# Patient Record
Sex: Female | Born: 1945 | Race: White | Hispanic: No | Marital: Married | Smoking: Never smoker
Health system: Southern US, Community
[De-identification: ages and names within clinical notes are randomized; demographics above are authoritative.]

## PROBLEM LIST (undated history)

## (undated) DIAGNOSIS — J45909 Unspecified asthma, uncomplicated: Secondary | ICD-10-CM

## (undated) DIAGNOSIS — D509 Iron deficiency anemia, unspecified: Secondary | ICD-10-CM

## (undated) DIAGNOSIS — E669 Obesity, unspecified: Secondary | ICD-10-CM

## (undated) DIAGNOSIS — I350 Nonrheumatic aortic (valve) stenosis: Secondary | ICD-10-CM

## (undated) DIAGNOSIS — I10 Essential (primary) hypertension: Secondary | ICD-10-CM

## (undated) DIAGNOSIS — K279 Peptic ulcer, site unspecified, unspecified as acute or chronic, without hemorrhage or perforation: Secondary | ICD-10-CM

## (undated) DIAGNOSIS — M810 Age-related osteoporosis without current pathological fracture: Secondary | ICD-10-CM

## (undated) DIAGNOSIS — I4892 Unspecified atrial flutter: Secondary | ICD-10-CM

## (undated) HISTORY — PX: BLADDER SUSPENSION: SHX72

## (undated) HISTORY — DX: Obesity, unspecified: E66.9

## (undated) HISTORY — DX: Unspecified asthma, uncomplicated: J45.909

## (undated) HISTORY — DX: Nonrheumatic aortic (valve) stenosis: I35.0

## (undated) HISTORY — PX: TUBAL LIGATION: SHX77

## (undated) HISTORY — PX: GASTRIC BYPASS: SHX52

## (undated) HISTORY — DX: Peptic ulcer, site unspecified, unspecified as acute or chronic, without hemorrhage or perforation: K27.9

## (undated) HISTORY — DX: Age-related osteoporosis without current pathological fracture: M81.0

## (undated) HISTORY — DX: Unspecified atrial flutter: I48.92

## (undated) HISTORY — PX: NASAL SINUS SURGERY: SHX719

## (undated) HISTORY — DX: Iron deficiency anemia, unspecified: D50.9

---

## 2005-09-09 ENCOUNTER — Encounter: Admission: RE | Admit: 2005-09-09 | Discharge: 2005-12-08 | Payer: Self-pay | Admitting: Family Medicine

## 2007-03-06 ENCOUNTER — Ambulatory Visit: Payer: Self-pay | Admitting: Internal Medicine

## 2010-11-10 NOTE — Assessment & Plan Note (Signed)
[facility]                             PULMONARY OFFICE NOTE   NAME:Betty Sheppard, Betty Sheppard                          MRN:          [id number]  DATE:03/06/2007                            DOB:          06-Apr-1946    ADDENDUM:   X-rays are available now for review from [facility], indicating  that she had a normal chest x-ray on August 07, 2006, and normal CT  scan of the chest also done on February 03, 2001.     Charlaine Dalton. Sherene Sires, MD, [facility]  Electronically Signed    MBW/MedQ  DD: 03/07/2007  DT: 03/08/2007  Job #: [id number]

## 2010-11-10 NOTE — Assessment & Plan Note (Signed)
[facility]                             PULMONARY OFFICE NOTE   NAME:HODGEJaliana, Medellin                          MRN:          [id number]  DATE:03/06/2007                            DOB:          19-Oct-1945    HISTORY:  This is a 66 year old white female never smoker, with a real  bad sore throat 20 years ago and ever since then asthma off and on  with a flare up three or four times a year but in between, no  significant symptoms.  No need for chronic therapy.  She comes in now  with daily symptoms dating all the way back to 2003, but has not  recovered back to baseline even with Advair and intermittent prednisone.  She says the thing that seemed to help her the most was being on  prolonged antibiotics for abdominal infection.  Presently, though she  is more limited by knees and fatigue than she is by dyspnea and does not  really think that her inhalers are doing her any good.  She denies any  pleuritic or exertional chest pain, orthopnea, PND or leg swelling.   The patient does have history of sleep apnea but says she cannot  tolerate any mask and also has been diagnosed with pulmonary  hypertension.   ALLERGIES:  None known.   PAST MEDICAL HISTORY:  1. Hypertension.  2. Morbid obesity complicated by hypertension.  3. Sleep apnea as noted above.  4. She has had remote sinus surgery in 2003. She says that didn't      help me at all.   Presently she is bothered by dyspnea with minimal activity and a hacking  cough that is worse after supper and does not seem to respond to her  Xopenex inhaler.   ALLERGIES:  None known.   MEDICATIONS:  Taken in detail on the worksheet.  Notable that she is on  Advair at the very highest dose.  Also using Singulair daily.   SOCIAL HISTORY:  She has never smoked.  Has worked as a [employer] operational officer.   FAMILY HISTORY:  Significant for heart disease in her mother, emphysema  in her brother.   REVIEW OF SYSTEMS:  Taken in  detail on the worksheet. Negative except as  outlined above.   PHYSICAL EXAMINATION:  GENERAL:  This is an obese, ambulatory white  female, 335 pounds and only 5 feet 3 inches.  HEENT:  Remarkable for nasal turbinate edema that is mild in nature.  Oropharynx clear.  NECK:  Supple without cervical adenopathy or tenderness.  Trachea  midline. No thyromegaly.  CHEST:  Perfectly clear bilaterally to percussion with excellent air  movement.  HEART:  Regular rhythm without murmur, gallop or rub.  S1 and S2 were  diminished, but no definite increase in P2.  ABDOMEN:  Massively obese, benign.  EXTREMITIES:  Warm without calf tenderness, cyanosis or clubbing or  edema.   LABORATORY DATA:  Chest x-rays were brought with the patient but they  had no date on them.  They showed relatively small lung volumes.  She  had  approximately 100 pages of outpatient notes all with some  nonspecific comments, like I am not sure what is going on here in  terms of an impression.  There is a note that she has calcified  granulomas CT scan but I do not have the actual CT scan reports from  [facility].   Her EKG dated July 25, 2006 is normal with no evidence of right  ventricular abnormality.   Polysomnogram report dated October 16, 2006 shows severe obstructive sleep  apnea with desaturations in the 80% range.   IMPRESSION:  I am not convinced this patient actually has asthma.  Note  that she did not need any form of chronic therapy prior to 2003 and now  has refractory symptoms even on the highest strength of Advair.  I  strongly suspect that much of her problem is reflux-related and/or  related to morbid obesity as evidenced by her PFTs that were reviewed  from September 09, 2006 showing no significant air flow obstruction and the  most significant finding being one of disproportionate reduction in  expiratory reserve volume.   I am going to recommend that she stop Advair at this point and   empirically place her on very low doses of Qvar at 40 mg two puffs  b.i.d. in case there is an asthmatic component to her problem that will  exacerbate off the Advair.  I further recommended for cough using  Mucinex DM two b.i.d.  if still coughing, add tramadol 50 mg every four  hours and, only if wheezing or short of breath, try Xopenex two puffs  every four hours.   I then spent extra time teaching her how to use her MDI effectively.  I  found out she was initially not capable of using MDI, so it may well be  her failure to improve on inhalers is due to failure to inhale.  Hopefully, we will be able to sort his out, depending on how she  responds to the above measures.   I would like to see her back in 15 days and at that point have a chance  to review the CT scans that she brought with her and also her response  to the above regimen.   I note that she is reported to have pulmonary hypertension with severe  sleep apnea.  Apparently she had trouble tolerating a mass but I would  be happy to refer her on to our sleep doctors here once I have sorted  out whether or not she had evidence of air flow obstruction that is  potentially reversible.   I did review with her a dietary measures to confront reflux which is  undoubtedly a component of her problem and ask her to switch the Nexium  to taking it before supper instead of taking it  before breakfast because most of her symptoms are exacerbated in the  early evening before she goes to bed.     Charlaine Dalton. Sherene Sires, MD, [facility]  Electronically Signed    MBW/MedQ  DD: 03/06/2007  DT: 03/07/2007  Job #: [id number]   cc:   Brandt Loosen, M.D.

## 2015-03-02 ENCOUNTER — Encounter (HOSPITAL_BASED_OUTPATIENT_CLINIC_OR_DEPARTMENT_OTHER): Payer: Self-pay | Admitting: Emergency Medicine

## 2015-03-02 ENCOUNTER — Emergency Department (HOSPITAL_BASED_OUTPATIENT_CLINIC_OR_DEPARTMENT_OTHER): Payer: Medicare Other

## 2015-03-02 ENCOUNTER — Emergency Department (HOSPITAL_BASED_OUTPATIENT_CLINIC_OR_DEPARTMENT_OTHER)
Admission: EM | Admit: 2015-03-02 | Discharge: 2015-03-02 | Disposition: A | Discharge status: Other Acute Inpatient Hospital | Payer: Medicare Other | Attending: Emergency Medicine | Admitting: Emergency Medicine

## 2015-03-02 DIAGNOSIS — R112 Nausea with vomiting, unspecified: Secondary | ICD-10-CM | POA: Insufficient documentation

## 2015-03-02 DIAGNOSIS — N2889 Other specified disorders of kidney and ureter: Secondary | ICD-10-CM | POA: Diagnosis not present

## 2015-03-02 DIAGNOSIS — Z79899 Other long term (current) drug therapy: Secondary | ICD-10-CM | POA: Insufficient documentation

## 2015-03-02 DIAGNOSIS — R1110 Vomiting, unspecified: Secondary | ICD-10-CM

## 2015-03-02 DIAGNOSIS — I10 Essential (primary) hypertension: Secondary | ICD-10-CM | POA: Diagnosis not present

## 2015-03-02 DIAGNOSIS — Z792 Long term (current) use of antibiotics: Secondary | ICD-10-CM | POA: Diagnosis not present

## 2015-03-02 HISTORY — DX: Essential (primary) hypertension: I10

## 2015-03-02 LAB — COMPREHENSIVE METABOLIC PANEL
ALT: 10 U/L — AB (ref 14–54)
ANION GAP: 11 (ref 5–15)
AST: 22 U/L (ref 15–41)
Albumin: 4.3 g/dL (ref 3.5–5.0)
Alkaline Phosphatase: 85 U/L (ref 38–126)
BUN: 15 mg/dL (ref 6–20)
CHLORIDE: 105 mmol/L (ref 101–111)
CO2: 22 mmol/L (ref 22–32)
Calcium: 9.7 mg/dL (ref 8.9–10.3)
Creatinine, Ser: 0.77 mg/dL (ref 0.44–1.00)
Glucose, Bld: 118 mg/dL — ABNORMAL HIGH (ref 65–99)
POTASSIUM: 3.9 mmol/L (ref 3.5–5.1)
SODIUM: 138 mmol/L (ref 135–145)
Total Bilirubin: 0.7 mg/dL (ref 0.3–1.2)
Total Protein: 7.8 g/dL (ref 6.5–8.1)

## 2015-03-02 LAB — URINALYSIS, ROUTINE W REFLEX MICROSCOPIC
Bilirubin Urine: NEGATIVE
GLUCOSE, UA: NEGATIVE mg/dL
Hgb urine dipstick: NEGATIVE
KETONES UR: 40 mg/dL — AB
LEUKOCYTES UA: NEGATIVE
NITRITE: NEGATIVE
PH: 7 (ref 5.0–8.0)
PROTEIN: NEGATIVE mg/dL
Specific Gravity, Urine: 1.017 (ref 1.005–1.030)
Urobilinogen, UA: 0.2 mg/dL (ref 0.0–1.0)

## 2015-03-02 LAB — CBC WITH DIFFERENTIAL/PLATELET
Basophils Absolute: 0 10*3/uL (ref 0.0–0.1)
Basophils Relative: 0 % (ref 0–1)
Eosinophils Absolute: 0 10*3/uL (ref 0.0–0.7)
Eosinophils Relative: 0 % (ref 0–5)
HEMATOCRIT: 38.8 % (ref 36.0–46.0)
HEMOGLOBIN: 13 g/dL (ref 12.0–15.0)
LYMPHS ABS: 1.3 10*3/uL (ref 0.7–4.0)
LYMPHS PCT: 15 % (ref 12–46)
MCH: 29.4 pg (ref 26.0–34.0)
MCHC: 33.5 g/dL (ref 30.0–36.0)
MCV: 87.8 fL (ref 78.0–100.0)
MONOS PCT: 5 % (ref 3–12)
Monocytes Absolute: 0.4 10*3/uL (ref 0.1–1.0)
NEUTROS ABS: 7.3 10*3/uL (ref 1.7–7.7)
NEUTROS PCT: 80 % — AB (ref 43–77)
Platelets: 280 10*3/uL (ref 150–400)
RBC: 4.42 MIL/uL (ref 3.87–5.11)
RDW: 13.8 % (ref 11.5–15.5)
WBC: 9.1 10*3/uL (ref 4.0–10.5)

## 2015-03-02 LAB — LIPASE, BLOOD: Lipase: 13 U/L — ABNORMAL LOW (ref 22–51)

## 2015-03-02 MED ORDER — SODIUM CHLORIDE 0.9 % IV SOLN
INTRAVENOUS | Status: DC
  Administered 2015-03-02: 17:00:00 via INTRAVENOUS

## 2015-03-02 MED ORDER — IOHEXOL 300 MG/ML  SOLN
100.0000 mL | Freq: Once | INTRAMUSCULAR | Status: AC | PRN
  Administered 2015-03-02: 100 mL via INTRAVENOUS

## 2015-03-02 MED ORDER — IOHEXOL 300 MG/ML  SOLN
25.0000 mL | Freq: Once | INTRAMUSCULAR | Status: AC | PRN
  Administered 2015-03-02: 25 mL via ORAL

## 2015-03-02 MED ORDER — ONDANSETRON HCL 4 MG/2ML IJ SOLN
4.0000 mg | Freq: Once | INTRAMUSCULAR | Status: AC
Start: 2015-03-02 — End: 2015-03-02
  Administered 2015-03-02: 4 mg via INTRAVENOUS
  Filled 2015-03-02: qty 2

## 2015-03-02 MED ORDER — MORPHINE SULFATE (PF) 4 MG/ML IV SOLN
4.0000 mg | Freq: Once | INTRAVENOUS | Status: AC
  Administered 2015-03-02: 4 mg via INTRAVENOUS
  Filled 2015-03-02: qty 1

## 2015-03-02 MED ORDER — ONDANSETRON HCL 4 MG/2ML IJ SOLN
4.0000 mg | Freq: Once | INTRAMUSCULAR | Status: AC
  Administered 2015-03-02: 4 mg via INTRAVENOUS
  Filled 2015-03-02: qty 2

## 2015-03-02 MED ORDER — PROMETHAZINE HCL 25 MG/ML IJ SOLN
12.5000 mg | Freq: Once | INTRAMUSCULAR | Status: AC
Start: 2015-03-02 — End: 2015-03-02
  Administered 2015-03-02: 12.5 mg via INTRAVENOUS
  Filled 2015-03-02: qty 1

## 2015-03-02 MED ORDER — SODIUM CHLORIDE 0.9 % IV BOLUS (SEPSIS)
1000.0000 mL | Freq: Once | INTRAVENOUS | Status: AC
  Administered 2015-03-02: 1000 mL via INTRAVENOUS

## 2015-03-02 NOTE — ED Notes (Signed)
Pt reports that she awoke around 0200 with nausea and mid abdominal pain, feels like if she vomited she would feel better, but she is unable to vomit anything but "white foam", states she has had gastric bypass 7 years ago

## 2015-03-02 NOTE — ED Provider Notes (Signed)
CSN: [id number]     Arrival date & time 03/02/15  1206 History   First MD Initiated Contact with Patient 03/02/15 1211     Chief Complaint  Patient presents with  . Nausea     (Consider location/radiation/quality/duration/timing/severity/associated sxs/prior Treatment) HPI Comments: Patient with a history of gastric bypass surgery, Roux-en-Y 7 years ago presents with upper abdominal pain. She states she woke up about 1 AM this morning with nausea and feeling that she needed to vomit. She's only vomited some white foam type stuff. She had sudden onset of pain at that time across her upper abdomen. It's been constant since that time. She's having normal bowel movements. She states she is passing gas. She denies any known fevers. She's not had any prior issues with the gastric bypass surgery. This was done in [place].     Past Medical History  Diagnosis Date  . Hypertension    Past Surgical History  Procedure Laterality Date  . Gastric bypass     History reviewed. No pertinent family history. Social History  Substance Use Topics  . Smoking status: Never Smoker   . Smokeless tobacco: None  . Alcohol Use: No   OB History    No data available     Review of Systems  Constitutional: Negative for fever, chills, diaphoresis and fatigue.  HENT: Negative for congestion, rhinorrhea and sneezing.   Eyes: Negative.   Respiratory: Negative for cough, chest tightness and shortness of breath.   Cardiovascular: Negative for chest pain and leg swelling.  Gastrointestinal: Positive for nausea and abdominal pain. Negative for vomiting, diarrhea and blood in stool.  Genitourinary: Negative for frequency, hematuria, flank pain and difficulty urinating.  Musculoskeletal: Negative for back pain and arthralgias.  Skin: Negative for rash.  Neurological: Negative for dizziness, speech difficulty, weakness, numbness and headaches.      Allergies  Levaquin and Prednisone  Home Medications   Prior  to Admission medications   Medication Sig Start Date End Date Taking? Authorizing Provider  amoxicillin (AMOXIL) 500 MG capsule Take 500 mg by mouth 3 (three) times daily.   Yes Historical Provider, MD  calcium-vitamin D (OSCAL WITH D) 500-200 MG-UNIT per tablet Take 1 tablet by mouth.   Yes Historical Provider, MD  diltiazem (DILACOR XR) 240 MG 24 hr capsule Take 240 mg by mouth daily.   Yes Historical Provider, MD  enalapril (VASOTEC) 20 MG tablet Take 20 mg by mouth daily.   Yes Historical Provider, MD  hydrochlorothiazide (MICROZIDE) 12.5 MG capsule Take 12.5 mg by mouth daily.   Yes Historical Provider, MD  montelukast (SINGULAIR) 10 MG tablet Take 10 mg by mouth at bedtime.   Yes Historical Provider, MD  oxyCODONE-acetaminophen (TYLOX) 5-500 MG per capsule Take 1 capsule by mouth every 4 (four) hours as needed for pain.   Yes Historical Provider, MD   BP 155/68 mmHg  Pulse 65  Temp(Src) 98 F (36.7 C) (Oral)  Resp 18  Ht 5\' 3"  (1.6 m)  Wt 180 lb (81.647 kg)  BMI 31.89 kg/m2  SpO2 100% Physical Exam  Constitutional: She is oriented to person, place, and time. She appears well-developed and well-nourished.  HENT:  Head: Normocephalic and atraumatic.  Eyes: Pupils are equal, round, and reactive to light.  Neck: Normal range of motion. Neck supple.  Cardiovascular: Normal rate, regular rhythm and normal heart sounds.   Pulmonary/Chest: Effort normal and breath sounds normal. No respiratory distress. She has no wheezes. She has no rales. She exhibits no  tenderness.  Abdominal: Soft. Bowel sounds are normal. There is tenderness (moderate tenderness across the upper abdomen). There is no rebound and no guarding.  Musculoskeletal: Normal range of motion. She exhibits no edema.  Lymphadenopathy:    She has no cervical adenopathy.  Neurological: She is alert and oriented to person, place, and time.  Skin: Skin is warm and dry. No rash noted.  Psychiatric: She has a normal mood and  affect.    ED Course  Procedures (including critical care time) Labs Review Labs Reviewed  COMPREHENSIVE METABOLIC PANEL - Abnormal; Notable for the following:    Glucose, Bld 118 (*)    ALT 10 (*)    All other components within normal limits  CBC WITH DIFFERENTIAL/PLATELET - Abnormal; Notable for the following:    Neutrophils Relative % 80 (*)    All other components within normal limits  LIPASE, BLOOD - Abnormal; Notable for the following:    Lipase 13 (*)    All other components within normal limits  URINALYSIS, ROUTINE W REFLEX MICROSCOPIC (NOT AT [facility]) - Abnormal; Notable for the following:    Ketones, ur 40 (*)    All other components within normal limits    Imaging Review Ct Abdomen Pelvis W Contrast  03/02/2015   CLINICAL DATA:  Sudden onset upper abdominal pain. Vomiting. Status post gastric bypass 7 years ago. Unable to tolerate oral contrast.  EXAM: CT ABDOMEN AND PELVIS WITH CONTRAST  TECHNIQUE: Multidetector CT imaging of the abdomen and pelvis was performed using the standard protocol following bolus administration of intravenous contrast.  CONTRAST:  25mL OMNIPAQUE IOHEXOL 300 MG/ML SOLN, OMNIPAQUE IOHEXOL 300 MG/ML SOLN  COMPARISON:  Radiographs obtained earlier today and abdomen and pelvis CT dated 12/30/2006.  FINDINGS: Previously noted calcified granulomata in the liver and spleen. No urinary tract calcifications are seen. Upper and mid abdominal surgical staples, compatible with the history of gastric bypass. No gastric or bowel dilatation. No free peritoneal fluid or air.  Oval area of low density in the mid left kidney, measuring 1.3 cm in maximum diameter on coronal image number 52. This measures 48 Hounsfield units in density on coronal image number 52 and 47 Hounsfield units in density on axial image number 30. It is difficult to determine if this ridge previously present due to in lack of intravenous contrast on the previous examination. On the delayed images  through the kidneys, this measures 58 Hounsfield units in density.  Unremarkable gallbladder, adrenal glands, right kidney, ureters, urinary bladder, uterus and ovaries. No enlarged lymph nodes. Clear lung bases. 5 mm linear nodule in the right middle lobe on image number 4, unchanged. There is also a 4 mm nodule in the left lower lobe on image number 30, not previously included.  Multiple colonic diverticula without evidence of diverticulitis. No evidence of appendicitis. Lumbar and lower thoracic spine degenerative changes and mild scoliosis.  IMPRESSION: 1. No acute abnormality. 2. 1.3 cm oval mass in the mid left kidney. This could represent a complex cyst or solid mass. This could be better characterized with pre and postcontrast magnetic resonance imaging of the kidneys on an outpatient basis. 3. Stable 5 mm right middle lobe nodule. The long-term stability is compatible with a benign process. 4. Interval inclusion of a 4 mm nodule in the left lower lobe, of doubtful clinical significance. This most likely represents a noncalcified granuloma in this patient with evidence of previous granulomatous infection. 5. Colonic diverticulosis without evidence of diverticulitis.   Electronically  Signed   By: Beckie Salts M.D.   On: 03/02/2015 14:43   Dg Abd Acute W/chest  03/02/2015   CLINICAL DATA:  Diffuse lower abdominal pain. Vomiting. History of gastric bypass.  EXAM: DG ABDOMEN ACUTE W/ 1V CHEST  COMPARISON:  Chest radiographs dated 10/29/2013. Abdomen and pelvis CT dated 12/30/2006.  FINDINGS: Mildly enlarged cardiac silhouette. Prominent pulmonary vasculature. Minimally prominent interstitial markings. Normal bowel gas pattern without free peritoneal air. Lumbar spine degenerative changes and mild scoliosis. Left upper and mid abdominal surgical staples. Small rounded calcifications in the right mid abdomen. These have a variable position relative to the right kidney and correspond to calcified granulomas seen  in the liver on the previous CT. There are also calcified granulomas again demonstrated in the spleen.  IMPRESSION: 1. No acute abnormality. 2. Stable mild cardiomegaly.   Electronically Signed   By: Beckie Salts M.D.   On: 03/02/2015 13:16   I have personally reviewed and evaluated these images and lab results as part of my medical decision-making.   EKG Interpretation None      MDM   Final diagnoses:  Renal mass  Intractable vomiting with nausea, vomiting of unspecified type    Patient presents with upper abdominal pain and vomiting. Her CT scan is unremarkable other than a small mass in her left kidney which will need outpatient follow-up. I did discuss this with the patient and her husband. I spoke with the general surgeon on-call with our system who felt that given the patient has had gastric bypass surgery, she needs to be seen by a bariatric surgeon. Given that she had the surgery at [facility], I spoke with the bariatric surgeon at [facility] who is accepted the patient for transfer. We will send a copy of the radiology disc with the patient.    Rolan Bucco, MD 03/02/15 [phone]

## 2016-09-08 IMAGING — CR DG ABDOMEN ACUTE W/ 1V CHEST
3 series · 3 of 3 positions shown · non-contrast
Comparison: Chest radiographs dated 10/29/2013. Abdomen and pelvis
CT dated 12/30/2006.

CLINICAL DATA: Diffuse lower abdominal pain. Vomiting. History of
gastric bypass.

EXAM:
DG ABDOMEN ACUTE W/ 1V CHEST

[w chest pa]
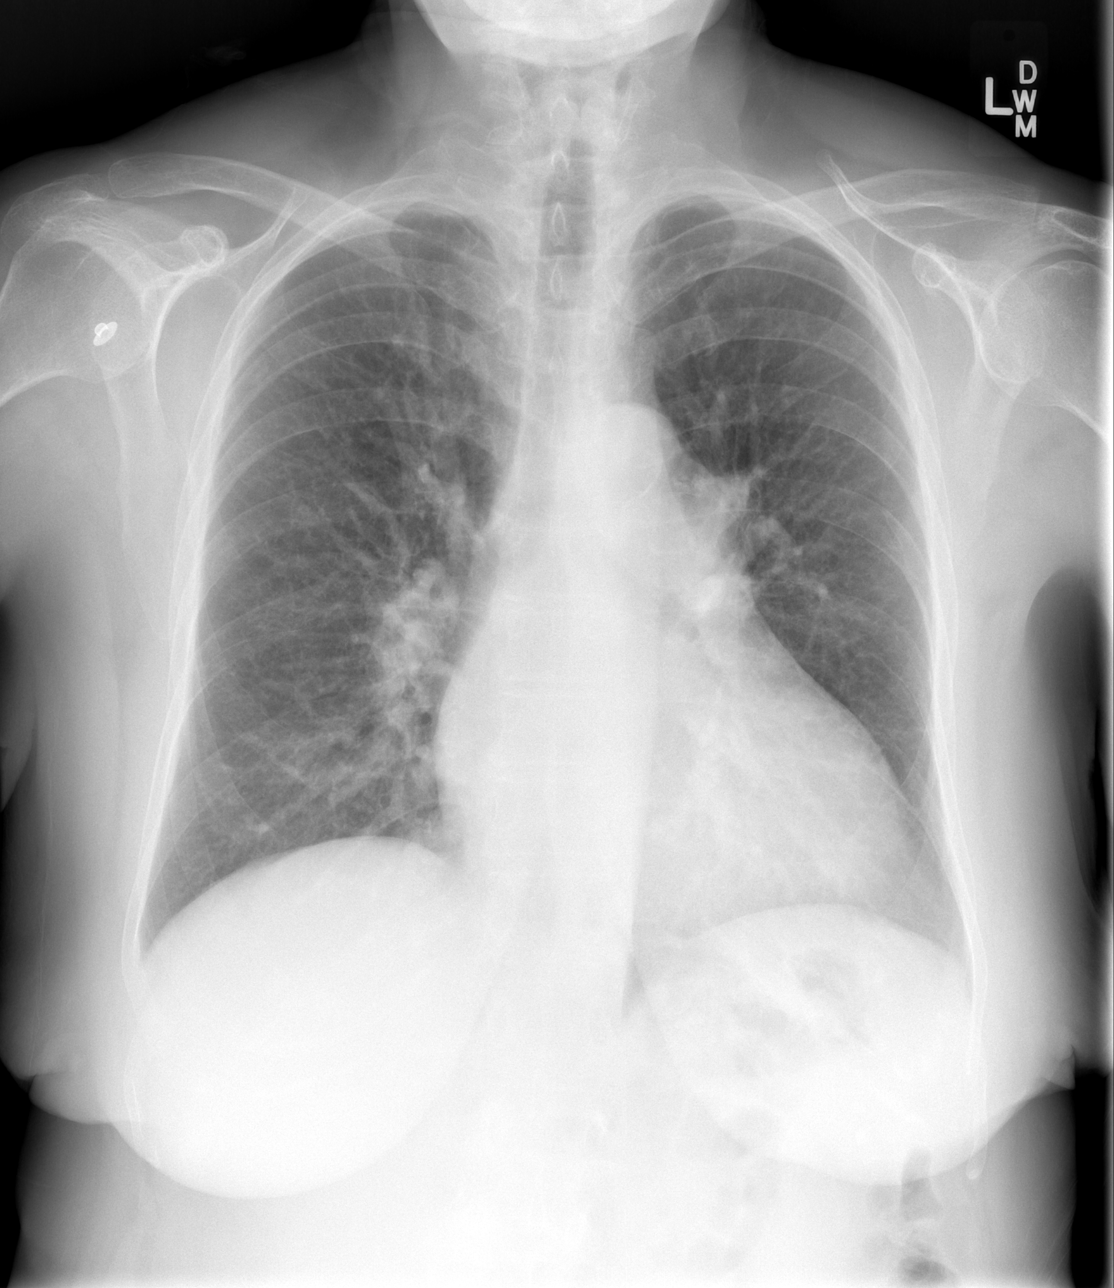

[w abdomen upright]
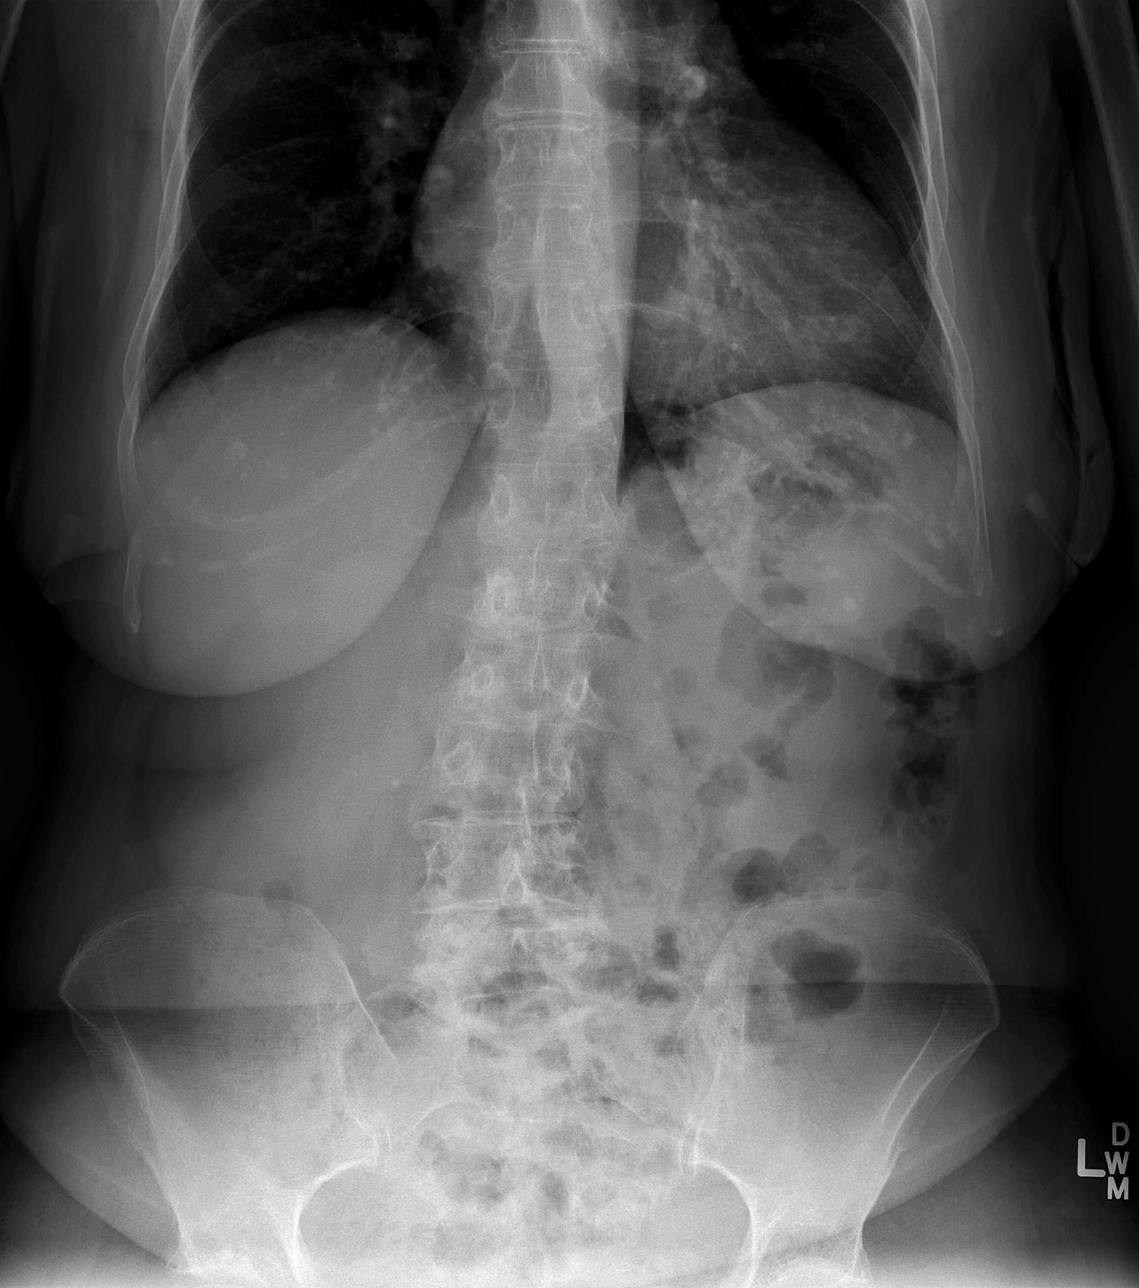

[t abdomen supine]
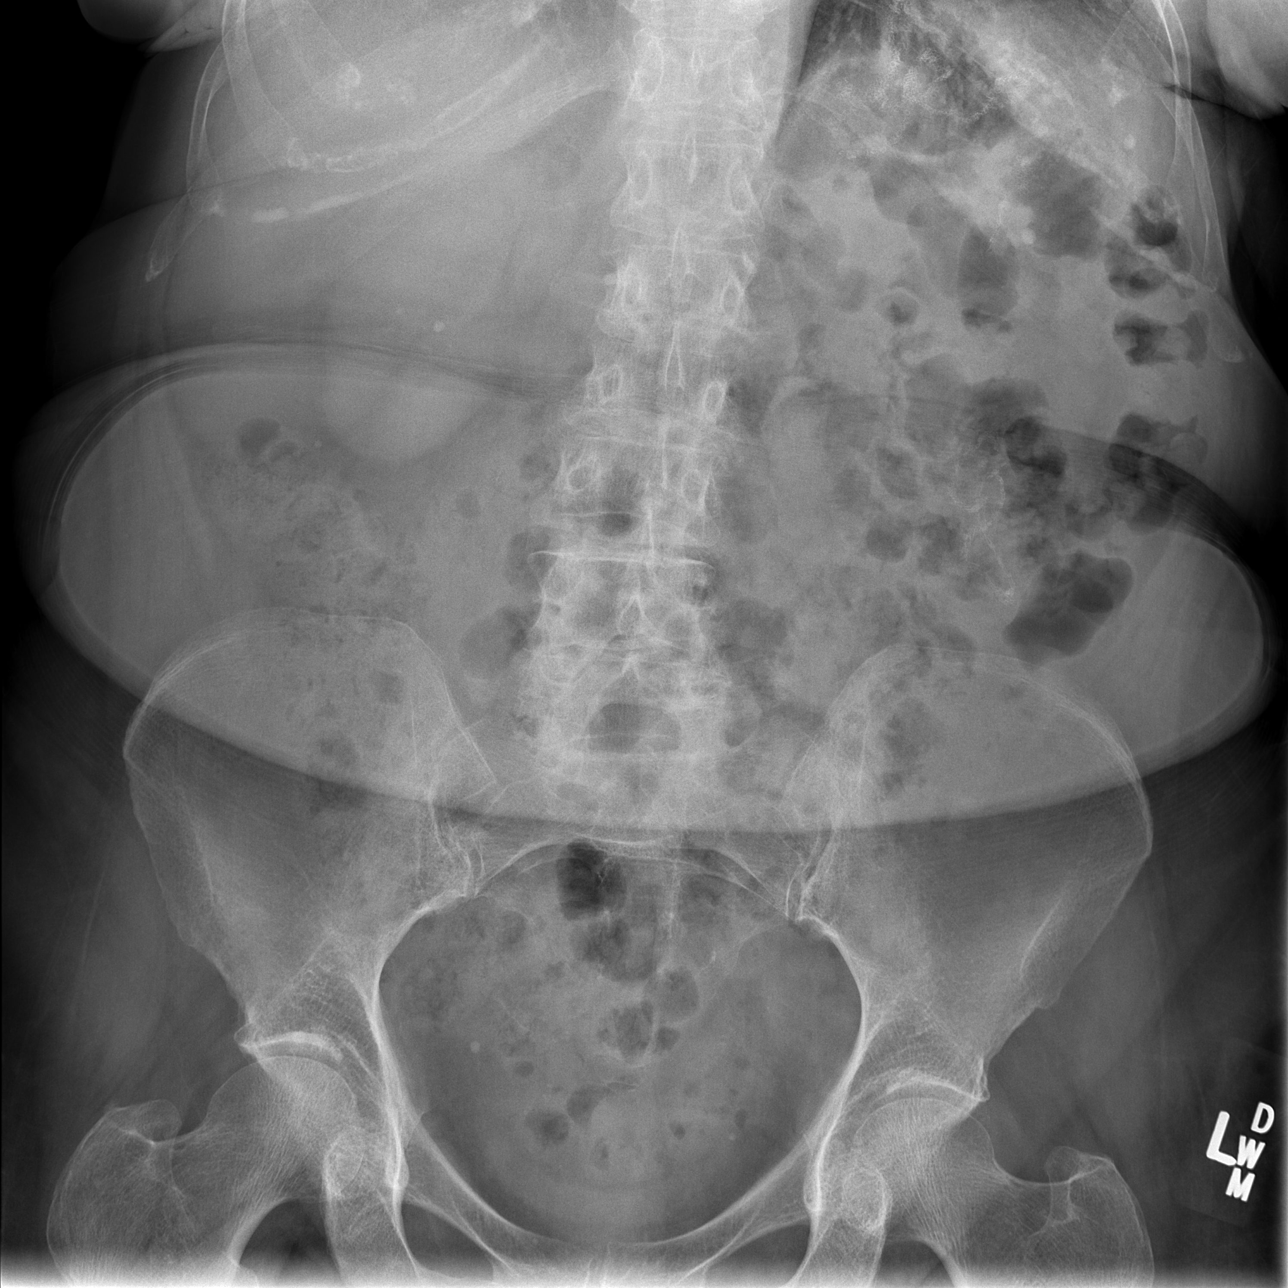

[3 of 3 positions shown; findings below may reference images not displayed]

FINDINGS: Mildly enlarged cardiac silhouette. Prominent pulmonary vasculature.
Minimally prominent interstitial markings. Normal bowel gas pattern
without free peritoneal air. Lumbar spine degenerative changes and
mild scoliosis. Left upper and mid abdominal surgical staples. Small
rounded calcifications in the right mid abdomen. These have a
variable position relative to the right kidney and correspond to
calcified granulomas seen in the liver on the previous CT. There are
also calcified granulomas again demonstrated in the spleen.
IMPRESSION: 1. No acute abnormality.
2. Stable mild cardiomegaly.

## 2016-09-08 IMAGING — CT CT ABD-PELV W/ CM
2 of 5 series · 16 of 46 positions shown, 18 images · IV contrast (APPLIED)
Comparison: Radiographs obtained earlier today and abdomen and
pelvis CT dated 12/30/2006.

CLINICAL DATA: Sudden onset upper abdominal pain. Vomiting. Status
post gastric bypass 7 years ago. Unable to tolerate oral contrast.

EXAM:
CT ABDOMEN AND PELVIS WITH CONTRAST
TECHNIQUE: Multidetector CT imaging of the abdomen and pelvis was performed
using the standard protocol following bolus administration of
intravenous contrast.
CONTRAST:  25mL OMNIPAQUE IOHEXOL 300 MG/ML SOLN, 100mL OMNIPAQUE
IOHEXOL 300 MG/ML SOLN

[Series 2: abd/pelvis 5.0 b31f · axial · 0.89mm/px · z∈[-334,+56]mm · 13 of 88 slices shown, 15 images]
[im 5/88  soft-tissue]
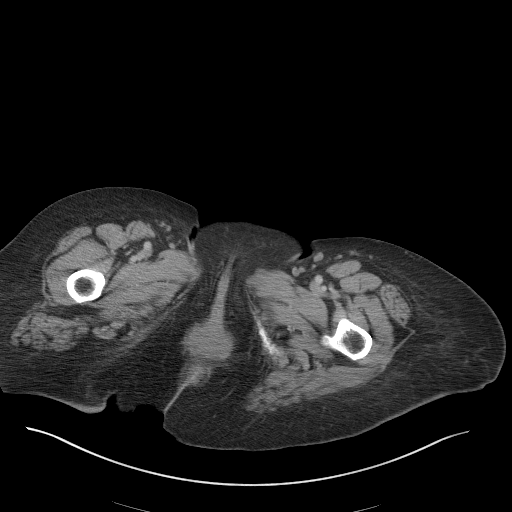
[im 5/88  bone]
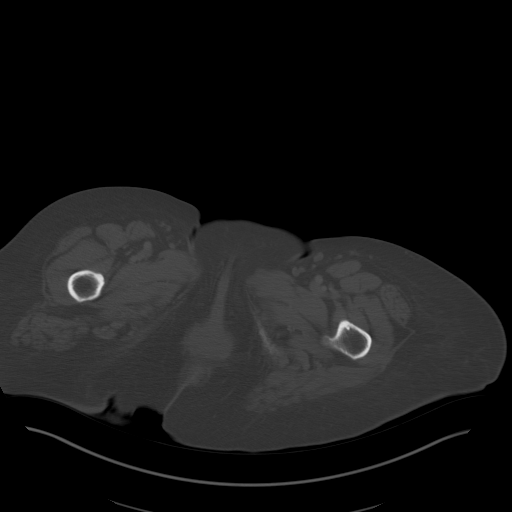
[im 14/88  soft-tissue]
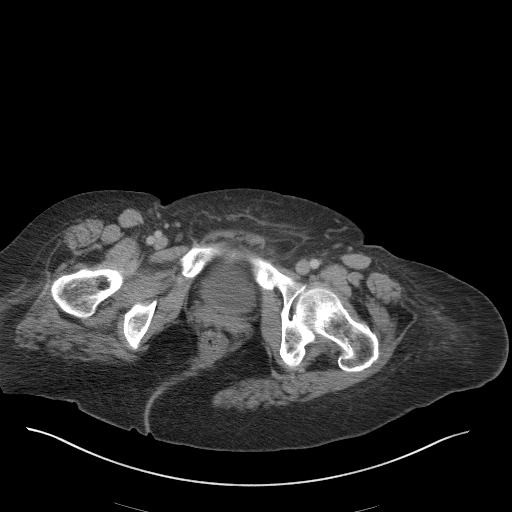
[im 19/88  soft-tissue]
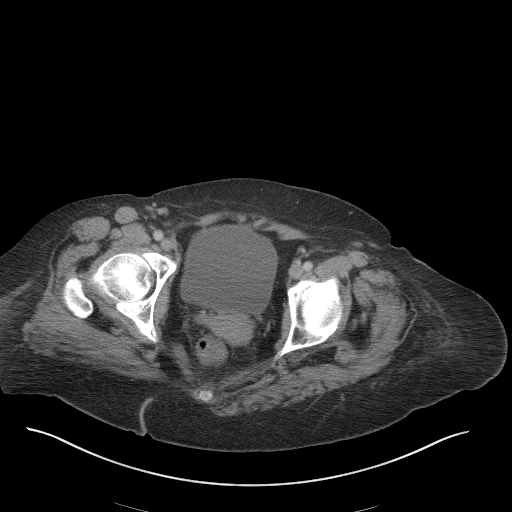
[im 23/88  soft-tissue]
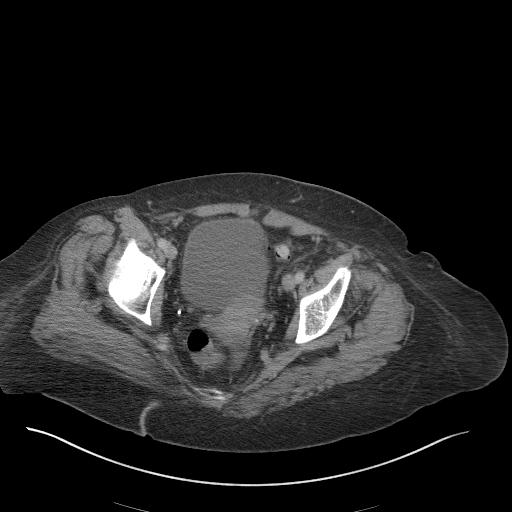
[im 33/88  soft-tissue]
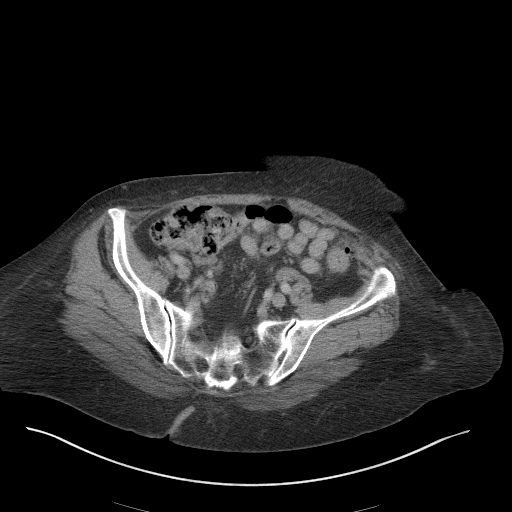
[im 37/88  soft-tissue]
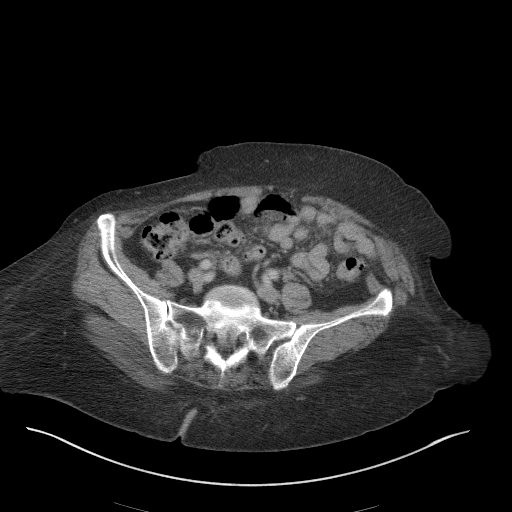
[im 46/88  soft-tissue]
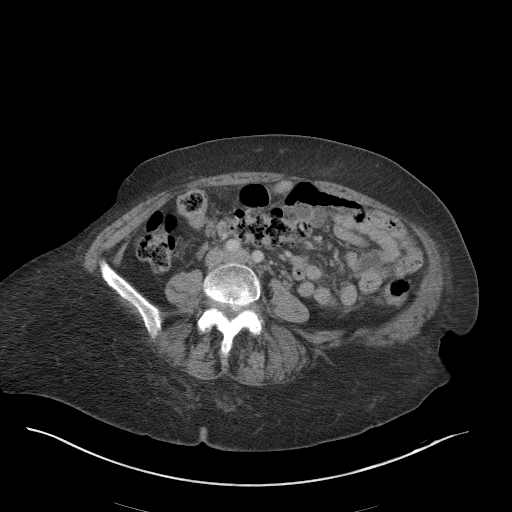
[im 51/88  soft-tissue]
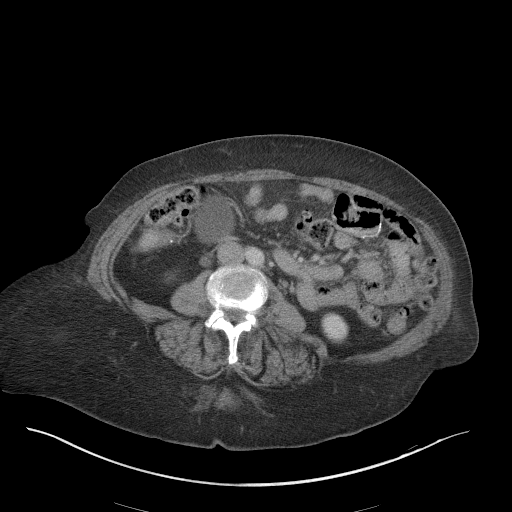
[im 55/88  soft-tissue]
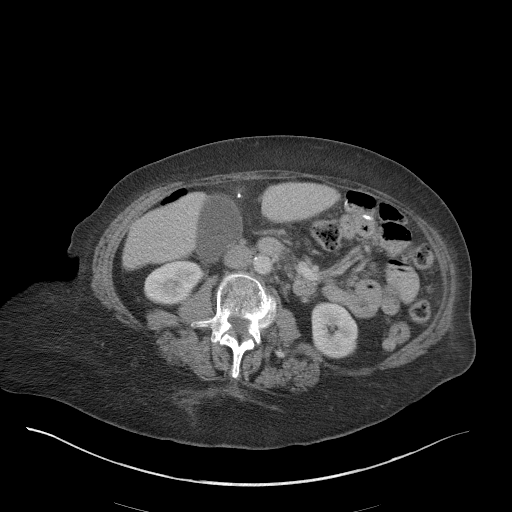
[im 55/88  bone]
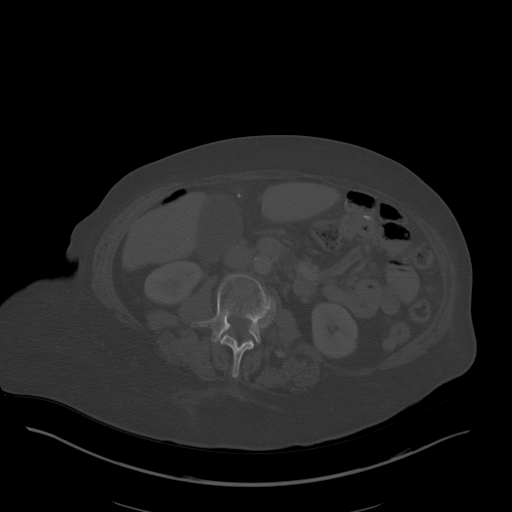
[im 65/88  soft-tissue]
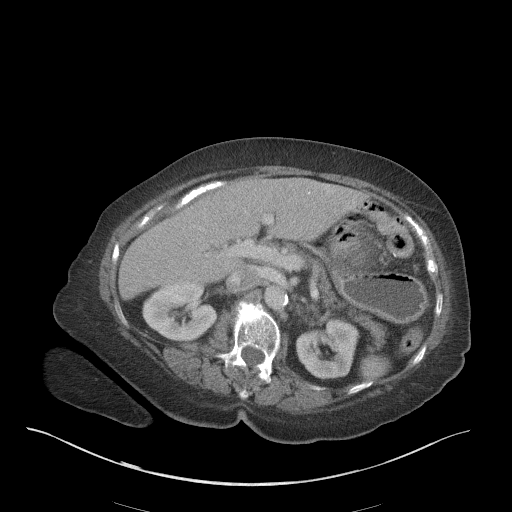
[im 69/88  soft-tissue]
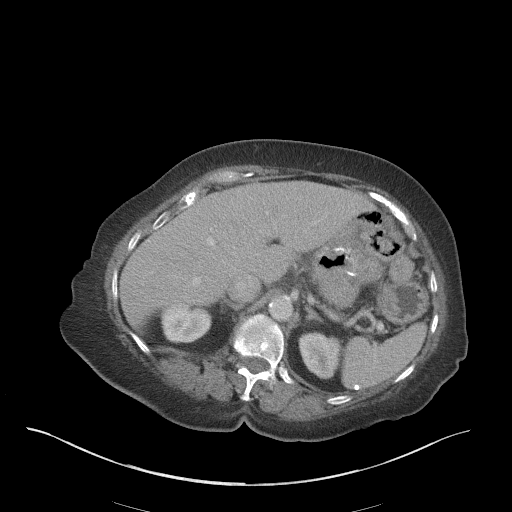
[im 74/88  soft-tissue]
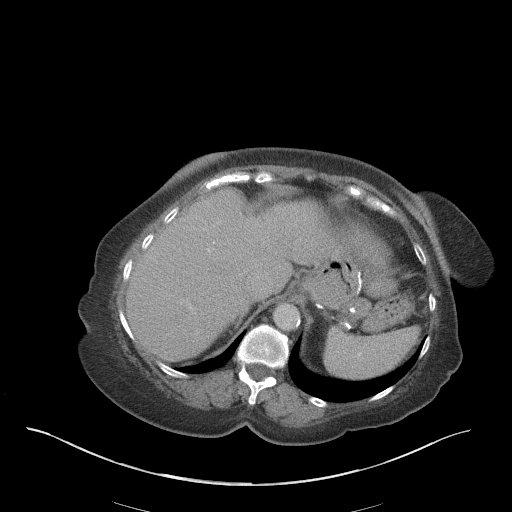
[im 83/88  soft-tissue]
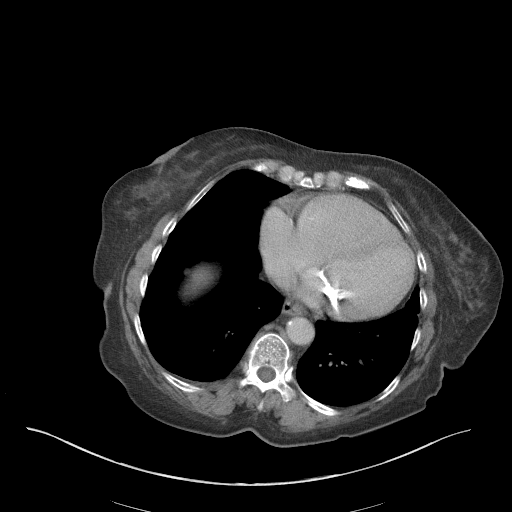

[Series 5: abd/pelvis 3.0 coronal · coronal · 0.90mm/px · 3 of 85 slices shown]
[im 29/85  soft-tissue]
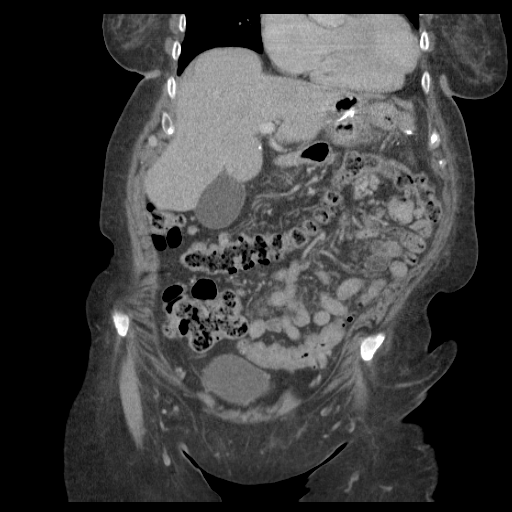
[im 38/85  soft-tissue]
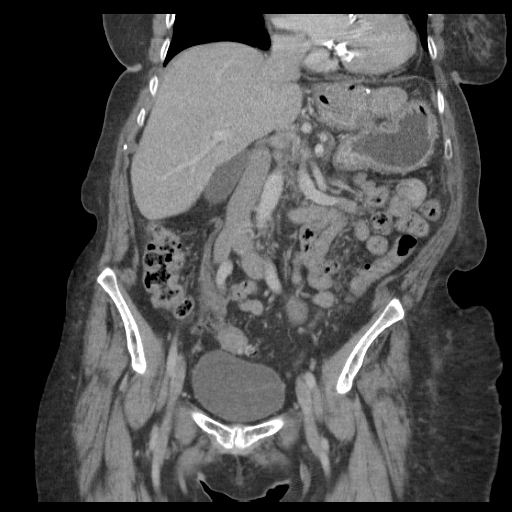
[im 47/85  soft-tissue]
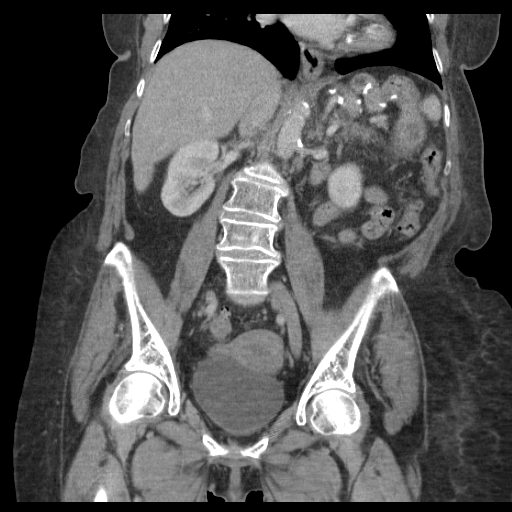

[16 of 46 positions shown; findings below may reference images not displayed]

FINDINGS: Previously noted calcified granulomata in the liver and spleen. No
urinary tract calcifications are seen. Upper and mid abdominal
surgical staples, compatible with the history of gastric bypass. No
gastric or bowel dilatation. No free peritoneal fluid or air.

Oval area of low density in the mid left kidney, measuring 1.3 cm in
maximum diameter on coronal image number 52. This measures 48
Hounsfield units in density on coronal image number 52 and 47
Hounsfield units in density on axial image number 30. It is
difficult to determine if this ridge previously present due to in
lack of intravenous contrast on the previous examination. On the
delayed images through the kidneys, this measures 58 Hounsfield
units in density.

Unremarkable gallbladder, adrenal glands, right kidney, ureters,
urinary bladder, uterus and ovaries. No enlarged lymph nodes. Clear
lung bases. 5 mm linear nodule in the right middle lobe on image
number 4, unchanged. There is also a 4 mm nodule in the left lower
lobe on image number 30, not previously included.

Multiple colonic diverticula without evidence of diverticulitis. No
evidence of appendicitis. Lumbar and lower thoracic spine
degenerative changes and mild scoliosis.
IMPRESSION: 1. No acute abnormality.
2. 1.3 cm oval mass in the mid left kidney. This could represent a
complex cyst or solid mass. This could be better characterized with
pre and postcontrast magnetic resonance imaging of the kidneys on an
outpatient basis.
3. Stable 5 mm right middle lobe nodule. The long-term stability is
compatible with a benign process.
4. Interval inclusion of a 4 mm nodule in the left lower lobe, of
doubtful clinical significance. This most likely represents a
noncalcified granuloma in this patient with evidence of previous
granulomatous infection.
5. Colonic diverticulosis without evidence of diverticulitis.

## 2018-06-28 HISTORY — PX: CARDIAC ELECTROPHYSIOLOGY STUDY AND ABLATION: SHX1294

## 2018-12-14 ENCOUNTER — Other Ambulatory Visit: Payer: Self-pay

## 2019-05-30 ENCOUNTER — Ambulatory Visit (INDEPENDENT_AMBULATORY_CARE_PROVIDER_SITE_OTHER): Payer: Medicare Other | Admitting: Critical Care Medicine

## 2019-05-30 ENCOUNTER — Other Ambulatory Visit: Payer: Self-pay

## 2019-05-30 ENCOUNTER — Encounter: Payer: Self-pay | Admitting: Critical Care Medicine

## 2019-05-30 VITALS — BP 134/70 | HR 66 | Temp 97.2°F | Ht 61.81 in | Wt 190.6 lb

## 2019-05-30 DIAGNOSIS — J4540 Moderate persistent asthma, uncomplicated: Secondary | ICD-10-CM | POA: Diagnosis not present

## 2019-05-30 DIAGNOSIS — J309 Allergic rhinitis, unspecified: Secondary | ICD-10-CM

## 2019-05-30 DIAGNOSIS — R05 Cough: Secondary | ICD-10-CM

## 2019-05-30 DIAGNOSIS — R059 Cough, unspecified: Secondary | ICD-10-CM

## 2019-05-30 LAB — CBC WITH DIFFERENTIAL/PLATELET
Basophils Absolute: 0 10*3/uL (ref 0.0–0.1)
Basophils Relative: 0.7 % (ref 0.0–3.0)
Eosinophils Absolute: 0.1 10*3/uL (ref 0.0–0.7)
Eosinophils Relative: 2.2 % (ref 0.0–5.0)
HCT: 38.8 % (ref 36.0–46.0)
Hemoglobin: 12.5 g/dL (ref 12.0–15.0)
Lymphocytes Relative: 17 % (ref 12.0–46.0)
Lymphs Abs: 0.9 10*3/uL (ref 0.7–4.0)
MCHC: 32.2 g/dL (ref 30.0–36.0)
MCV: 93.9 fl (ref 78.0–100.0)
Monocytes Absolute: 0.4 10*3/uL (ref 0.1–1.0)
Monocytes Relative: 7.8 % (ref 3.0–12.0)
Neutro Abs: 3.9 10*3/uL (ref 1.4–7.7)
Neutrophils Relative %: 72.3 % (ref 43.0–77.0)
Platelets: 256 10*3/uL (ref 150.0–400.0)
RBC: 4.13 Mil/uL (ref 3.87–5.11)
RDW: 14.7 % (ref 11.5–15.5)
WBC: 5.4 10*3/uL (ref 4.0–10.5)

## 2019-05-30 MED ORDER — CETIRIZINE HCL 10 MG PO TABS
10.0000 mg | ORAL_TABLET | Freq: Every day | ORAL | 11 refills | Status: DC
Start: 2019-05-30 — End: 2020-06-24

## 2019-05-30 MED ORDER — BREO ELLIPTA 100-25 MCG/INH IN AEPB: 1.0000 | INHALATION_SPRAY | Freq: Every day | RESPIRATORY_TRACT | 11 refills | Status: DC

## 2019-05-30 MED ORDER — BREO ELLIPTA 100-25 MCG/INH IN AEPB: 1.0000 | INHALATION_SPRAY | Freq: Every day | RESPIRATORY_TRACT | 0 refills | Status: DC

## 2019-05-30 NOTE — Telephone Encounter (Signed)
Mychart message received by pt which is posted below:  To: [facility] POOL    From: Betty Sheppard    Created: 05/30/2019 12:52 PM     *-*-*This message was handled on 05/30/2019 3:11 PM by Leslie Langille P*-*-*  Took my first does of the Breo it increase my heart rate to 83 and it is normally in the 60 range.. I just had a heart ablation on November 10 for irregular heat beat and fast heart beat.  The increase causes me concern.  Saw Dr Carlis Abbott  today   Please advise     Dr. Carlis Abbott, please advise on this for pt. Thanks!

## 2019-05-30 NOTE — Patient Instructions (Addendum)
Thank you for visiting Dr. Carlis Abbott at [facility]. We recommend the following: Orders Placed This Encounter  Procedures  . IgE  . IgG, IgA, IgM  . CBC w/Diff  . Pulmonary function test   Orders Placed This Encounter  Procedures  . IgE    Standing Status:   Future    Standing Expiration Date:   05/29/2020  . IgG, IgA, IgM    Standing Status:   Future    Standing Expiration Date:   05/29/2020  . CBC w/Diff    Standing Status:   Future    Standing Expiration Date:   05/29/2020  . Pulmonary function test    Standing Status:   Future    Standing Expiration Date:   05/29/2020    Order Specific Question:   Where should this test be performed?    Answer:    Pulmonary    Order Specific Question:   Full PFT: includes the following: basic spirometry, spirometry pre & post bronchodilator, diffusion capacity (DLCO), lung volumes    Answer:   Full PFT    Meds ordered this encounter  Medications  . cetirizine (ZYRTEC) 10 MG tablet    Sig: Take 1 tablet (10 mg total) by mouth daily.    Dispense:  30 tablet    Refill:  11  . fluticasone furoate-vilanterol (BREO ELLIPTA) 100-25 MCG/INH AEPB    Sig: Inhale 1 puff into the lungs daily.    Dispense:  1 each    Refill:  11     Stop taking Pulmicort/ budesonide nebulizers. Keep taking Singulair daily. It is okay to use levalbuterol as needed.  Return in about 6 weeks (around 07/11/2019).    Please do your part to reduce the spread of COVID-19.

## 2019-05-30 NOTE — Progress Notes (Signed)
Synopsis: Referred in December 2020 for bronchiectasis, cough by Betty Gerlach, PA.  Subjective:   PATIENT ID: Betty Sheppard GENDER: female DOB: August 21, 1945, MRN: [id number]  Chief Complaint  Patient presents with   Consult    Patient is here for cough and Bronchiectasis. Went to urgent care 11/26 and was given antibiotics which seem to be helping.     Betty Sheppard is a 73 year old woman with a history of recurrent sinusitis and reported bronchiectasis who presents to establish care by her primary care provider.  She was diagnosed with bronchiectasis based on an abnormal CT several years ago, but subsequent CT scans have not redemonstrated this per the report she provided.  She has had chronic sinus infections, requiring her to be on chronic suppressive once daily amoxicillin for many years.  When she gets a sinus infection it quickly moves to her chest where she has productive cough, wheezing, nasal congestion.  To get over this she requires a very prolonged course of antibiotics and a steroid shot.  If her symptoms have not fully resolved when she comes off antibiotics her symptoms will recur.  She often ends up requiring multiple doses of antibiotics.  When she has these episodes she has cough, thick sputum, wheezing, and severe fatigue.  She describes her sputum as looking like bread dough.  When she is at her baseline she does not have symptoms at all and is very active.  She has these episodes multiple times a year, sometimes as frequently as every other month. They turn into pneumonia but she has always had "haziness" on chest xrays rather than a lobar pneumonia. These episodes began about 20 years ago.  She had no childhood lung or sinus diseases.  There is no family history of immune deficiencies, bronchiectasis, or asthma.  Her mother had sinus issues, but nothing like her severity.  She takes Mucinex twice daily, Rhinocort nasal spray daily, sinus rinses daily, and levalbuterol as needed.  During  her episodes she will use Pulmicort nebulizers multiple times per day.  Weather changes and being in hot air worsen her symptoms.  Her symptoms are most common/worst from October to February and again in June every every year.  She has never had allergy testing or been treated with long-acting inhalers.  She denies a history of rashes or eczema.  She is followed by an ENT who has encouraged ongoing use of Rhinocort and saline rinses.  She was previously seen by pulmonologist Dr. Michela Sheppard at [place].  She was previously seen by rheumatology and had negative work-up.  She had a recent episode that put her in the hospital at [facility].  She had a CT chest in October 2020 and an MRI of her head to evaluate her sinuses (postsurgical changes, but no other abnormalities).  She was seen in urgent care 5 days ago for ongoing symptoms and was prescribed doxycycline for 10 days and given a Kenalog shot. She has had her seasonal flu vaccine.      Past Medical History:  Diagnosis Date   Asthma    Atrial flutter (HCC)    Hypertension    Iron deficiency anemia    Obesity    Osteoporosis    PUD (peptic ulcer disease)      Family History  Problem Relation Age of Onset   Sinusitis Mother    Heart disease Mother    Lung cancer Father    Autoimmune disease Sister        Goodpasture disease  Autoimmune disease Paternal Aunt        Goodpasture disease   Autoimmune disease Paternal Uncle        Goodpasture disease     Past Surgical History:  Procedure Laterality Date   BLADDER SUSPENSION     CARDIAC ELECTROPHYSIOLOGY STUDY AND ABLATION  2020   GASTRIC BYPASS     NASAL SINUS SURGERY     b/l antrostomies   TUBAL LIGATION      Social History   Socioeconomic History   Marital status: Married    Spouse name: Not on file   Number of children: Not on file   Years of education: Not on file   Highest education level: Not on file  Occupational History   Not on file  Social  Needs   Financial resource strain: Not on file   Food insecurity    Worry: Not on file    Inability: Not on file   Transportation needs    Medical: Not on file    Non-medical: Not on file  Tobacco Use   Smoking status: Never Smoker   Smokeless tobacco: Never Used  Substance and Sexual Activity   Alcohol use: No   Drug use: Never   Sexual activity: Not on file  Lifestyle   Physical activity    Days per week: Not on file    Minutes per session: Not on file   Stress: Not on file  Relationships   Social connections    Talks on phone: Not on file    Gets together: Not on file    Attends religious service: Not on file    Active member of club or organization: Not on file    Attends meetings of clubs or organizations: Not on file    Relationship status: Not on file   Intimate partner violence    Fear of current or ex partner: Not on file    Emotionally abused: Not on file    Physically abused: Not on file    Forced sexual activity: Not on file  Other Topics Concern   Not on file  Social History Narrative   Not on file     Allergies  Allergen Reactions   Latex    Levaquin [Levofloxacin]    Nsaids    Prednisone      Immunization History  Administered Date(s) Administered   Influenza Split 03/20/2009, 03/30/2010, 04/16/2011, 04/04/2012, 04/05/2013   Influenza, High Dose Seasonal PF 05/05/2015, 04/12/2016, 04/19/2019   Influenza, Seasonal, Injecte, Preservative Fre 04/04/2012, 04/05/2013, 04/22/2014   Influenza,inj,quad, With Preservative 03/20/2009, 03/30/2010, 04/16/2011, 04/05/2013   Influenza-Unspecified 03/20/2009, 03/30/2010, 04/16/2011, 04/04/2012, 04/04/2012, 04/05/2013, 04/05/2013, 04/22/2014, 05/05/2015, 04/12/2016, 04/14/2017, 04/03/2018   Pneumococcal Conjugate-13 05/19/2015, 05/19/2015   Pneumococcal Polysaccharide-23 11/04/2007, 11/04/2007, 10/11/2008, 10/11/2008, 02/28/2013, 02/28/2013, 12/14/2018   Pneumococcal-Unspecified  11/04/2007, 10/11/2008, 02/28/2013   Tdap 12/14/2018   Zoster 05/16/2012   Zoster Recombinat (Shingrix) 05/16/2012, 05/16/2012    Outpatient Medications Prior to Visit  Medication Sig Dispense Refill   apixaban (ELIQUIS) 5 MG TABS tablet Take 5 mg by mouth daily.     budesonide (PULMICORT) 0.5 MG/2ML nebulizer solution Inhale 0.5 mg into the lungs as needed.     budesonide (RHINOCORT AQUA) 32 MCG/ACT nasal spray Place 1 spray into both nostrils daily.     calcium-vitamin D (OSCAL WITH D) 500-200 MG-UNIT per tablet Take 1 tablet by mouth.     cyanocobalamin (,VITAMIN B-12,) 1000 MCG/ML injection Inject 1,000 mcg into the muscle every 30 (thirty) days.  cycloSPORINE (RESTASIS) 0.05 % ophthalmic emulsion Place 0.05 drops into both eyes daily.     dextromethorphan-guaiFENesin (MUCINEX DM) 30-600 MG 12hr tablet Take 1 tablet by mouth 2 (two) times daily.      diclofenac Sodium (VOLTAREN) 1 % GEL Apply 1 application topically.     diltiazem (DILACOR XR) 240 MG 24 hr capsule Take 240 mg by mouth daily.     doxycycline (DORYX) 100 MG EC tablet Take 100 mg by mouth 2 (two) times daily.     gabapentin (NEURONTIN) 300 MG capsule Take 300 mg by mouth as needed.     levalbuterol (XOPENEX HFA) 45 MCG/ACT inhaler Inhale 1 puff into the lungs as needed.     Loteprednol Etabonate 0.5 % OINT Place into both eyes daily as needed.     montelukast (SINGULAIR) 10 MG tablet Take 10 mg by mouth at bedtime.     Multiple Vitamins-Minerals (MULTIVITAMIN WITH MINERALS) tablet Take by mouth.     oxyCODONE-acetaminophen (TYLOX) 5-500 MG per capsule Take 1 capsule by mouth every 4 (four) hours as needed for pain.     pantoprazole (PROTONIX) 40 MG tablet Take 40 mg by mouth daily.     potassium gluconate 595 (99 K) MG TABS tablet Take 99 mg by mouth daily.     Probiotic Product (PHILLIPS COLON HEALTH) CAPS Take 1 capsule by mouth daily.     tobramycin (TOBREX) 0.3 % ophthalmic solution Place  into both eyes as needed.     Zinc Gluconate 50 MG CAPS Take 50 mg by mouth daily.     amoxicillin (AMOXIL) 500 MG capsule Take 500 mg by mouth 3 (three) times daily.     enalapril (VASOTEC) 20 MG tablet Take 20 mg by mouth daily.     hydrochlorothiazide (MICROZIDE) 12.5 MG capsule Take 12.5 mg by mouth daily.     No facility-administered medications prior to visit.     Review of Systems  Constitutional: Negative for chills, fever and weight loss.  HENT: Positive for congestion. Negative for nosebleeds.        Post nasal drip  Eyes: Negative.   Respiratory: Positive for cough, sputum production, shortness of breath and wheezing.   Cardiovascular: Negative for chest pain and leg swelling.  Gastrointestinal: Negative for heartburn, nausea and vomiting.  Genitourinary: Negative.   Musculoskeletal: Positive for joint pain.  Skin: Negative for rash.  Neurological: Negative.   Endo/Heme/Allergies: Negative.   Psychiatric/Behavioral: Negative.      Objective:   Vitals:   05/30/19 0934  BP: 134/70  Pulse: 66  Temp: (!) 97.2 F (36.2 C)  TempSrc: Temporal  SpO2: 98%  Weight: 190 lb 9.6 oz (86.5 kg)  Height: 5' 1.81" (1.57 m)   98% on   RA BMI Readings from Last 3 Encounters:  05/30/19 35.07 kg/m  03/02/15 31.89 kg/m   Wt Readings from Last 3 Encounters:  05/30/19 190 lb 9.6 oz (86.5 kg)  03/02/15 180 lb (81.6 kg)    Physical Exam Vitals signs reviewed.  Constitutional:      General: She is not in acute distress.    Appearance: Normal appearance. She is obese. She is not ill-appearing.  HENT:     Head: Normocephalic and atraumatic.     Nose:     Comments: Deferred due to masking requirement.    Mouth/Throat:     Comments: Deferred due to masking requirement. Eyes:     General: No scleral icterus. Neck:     Musculoskeletal: Neck supple.  Cardiovascular:     Rate and Rhythm: Normal rate and regular rhythm.     Comments: 2/6 right sternal border crescendo  decrescendo murmur Pulmonary:     Comments: Sitting comfortably on room air, no conversational dyspnea.  Clear to auscultation bilaterally. Musculoskeletal:     Comments: Minimal symmetric distal lower extremity edema  Lymphadenopathy:     Cervical: No cervical adenopathy.  Skin:    General: Skin is warm and dry.     Findings: No rash.  Neurological:     General: No focal deficit present.     Mental Status: She is alert.     Motor: No weakness.     Coordination: Coordination normal.  Psychiatric:        Mood and Affect: Mood normal.        Behavior: Behavior normal.      CBC    Component Value Date/Time   WBC 5.4 05/30/2019 1029   RBC 4.13 05/30/2019 1029   HGB 12.5 05/30/2019 1029   HCT 38.8 05/30/2019 1029   PLT 256.0 05/30/2019 1029   MCV 93.9 05/30/2019 1029   MCH 29.4 03/02/2015 1245   MCHC 32.2 05/30/2019 1029   RDW 14.7 05/30/2019 1029   LYMPHSABS 0.9 05/30/2019 1029   MONOABS 0.4 05/30/2019 1029   EOSABS 0.1 05/30/2019 1029   BASOSABS 0.0 05/30/2019 1029    CHEMISTRY No results for input(s): NA, K, CL, CO2, GLUCOSE, BUN, CREATININE, CALCIUM, MG, PHOS in the last 168 hours. CrCl cannot be calculated (Patient's most recent lab result is older than the maximum 21 days allowed.).   Chest Imaging- films reviewed: CT abdomen pelvis 03/02/2015, lung films reviewed-lateral poorly defined GGO in inferior lingula.  4 mm RML nodule  CXR, 1 view 03/02/2015-hilar fullness on the right, likely pulmonary artery.  Scoliosis.  Pulmonary Functions Testing Results: No flowsheet data found.       Assessment & Plan:     ICD-10-CM   1. Moderate persistent asthma without complication  J45.40 Pulmonary function test    IgE    IgG, IgA, IgM    CBC w/Diff    CBC w/Diff    IgG, IgA, IgM    IgE  2. Cough  R05 Pulmonary function test  3. Allergic rhinitis, unspecified seasonality, unspecified trigger  J30.9 Pulmonary function test    IgE    IgG, IgA, IgM    CBC w/Diff     CBC w/Diff    IgG, IgA, IgM    IgE     Suspect allergic asthma with frequent exacerbations -Start Breo 100-50.  Stop Pulmicort nebs. -Levalbuterol PRN -Continue Singulair -Adding Zyrtec -Continue sinus regimen -Up-to-date on seasonal flu vaccine -PFTs -IgE and CBC with differential  Chronic sinusitis -Adding Zyrtec -Continue Rhinocort and saline rinses -Long-term the goal would be to get her off daily antibiotics due to the concern for resistance. -Checking immunoglobulin levels due to recurrent infections  Reported history of bronchiectasis -Have requested that she request [facility] to send discs of her images.  I am hopeful that she had more airway thickening than true bronchiectasis.  RTC in 4 to 6 weeks after PFTs.    Current Outpatient Medications:    apixaban (ELIQUIS) 5 MG TABS tablet, Take 5 mg by mouth daily., Disp: , Rfl:    budesonide (PULMICORT) 0.5 MG/2ML nebulizer solution, Inhale 0.5 mg into the lungs as needed., Disp: , Rfl:    budesonide (RHINOCORT AQUA) 32 MCG/ACT nasal spray, Place 1 spray into both nostrils  daily., Disp: , Rfl:    calcium-vitamin D (OSCAL WITH D) 500-200 MG-UNIT per tablet, Take 1 tablet by mouth., Disp: , Rfl:    cyanocobalamin (,VITAMIN B-12,) 1000 MCG/ML injection, Inject 1,000 mcg into the muscle every 30 (thirty) days., Disp: , Rfl:    cycloSPORINE (RESTASIS) 0.05 % ophthalmic emulsion, Place 0.05 drops into both eyes daily., Disp: , Rfl:    dextromethorphan-guaiFENesin (MUCINEX DM) 30-600 MG 12hr tablet, Take 1 tablet by mouth 2 (two) times daily. , Disp: , Rfl:    diclofenac Sodium (VOLTAREN) 1 % GEL, Apply 1 application topically., Disp: , Rfl:    diltiazem (DILACOR XR) 240 MG 24 hr capsule, Take 240 mg by mouth daily., Disp: , Rfl:    doxycycline (DORYX) 100 MG EC tablet, Take 100 mg by mouth 2 (two) times daily., Disp: , Rfl:    gabapentin (NEURONTIN) 300 MG capsule, Take 300 mg by mouth as needed., Disp: , Rfl:     levalbuterol (XOPENEX HFA) 45 MCG/ACT inhaler, Inhale 1 puff into the lungs as needed., Disp: , Rfl:    Loteprednol Etabonate 0.5 % OINT, Place into both eyes daily as needed., Disp: , Rfl:    montelukast (SINGULAIR) 10 MG tablet, Take 10 mg by mouth at bedtime., Disp: , Rfl:    Multiple Vitamins-Minerals (MULTIVITAMIN WITH MINERALS) tablet, Take by mouth., Disp: , Rfl:    oxyCODONE-acetaminophen (TYLOX) 5-500 MG per capsule, Take 1 capsule by mouth every 4 (four) hours as needed for pain., Disp: , Rfl:    pantoprazole (PROTONIX) 40 MG tablet, Take 40 mg by mouth daily., Disp: , Rfl:    potassium gluconate 595 (99 K) MG TABS tablet, Take 99 mg by mouth daily., Disp: , Rfl:    Probiotic Product (PHILLIPS COLON HEALTH) CAPS, Take 1 capsule by mouth daily., Disp: , Rfl:    tobramycin (TOBREX) 0.3 % ophthalmic solution, Place into both eyes as needed., Disp: , Rfl:    Zinc Gluconate 50 MG CAPS, Take 50 mg by mouth daily., Disp: , Rfl:    amoxicillin (AMOXIL) 500 MG capsule, Take 500 mg by mouth 3 (three) times daily., Disp: , Rfl:    cetirizine (ZYRTEC) 10 MG tablet, Take 1 tablet (10 mg total) by mouth daily., Disp: 30 tablet, Rfl: 11   enalapril (VASOTEC) 20 MG tablet, Take 20 mg by mouth daily., Disp: , Rfl:    fluticasone furoate-vilanterol (BREO ELLIPTA) 100-25 MCG/INH AEPB, Inhale 1 puff into the lungs daily., Disp: 1 each, Rfl: 11   fluticasone furoate-vilanterol (BREO ELLIPTA) 100-25 MCG/INH AEPB, Inhale 1 puff into the lungs daily., Disp: 28 each, Rfl: 0   hydrochlorothiazide (MICROZIDE) 12.5 MG capsule, Take 12.5 mg by mouth daily., Disp: , Rfl:    Steffanie DunnLaura P Timiyah Romito, DO [facility] 05/30/2019 6:03 PM

## 2019-05-31 ENCOUNTER — Telehealth: Payer: Self-pay | Admitting: Critical Care Medicine

## 2019-05-31 DIAGNOSIS — J329 Chronic sinusitis, unspecified: Secondary | ICD-10-CM

## 2019-05-31 DIAGNOSIS — D804 Selective deficiency of immunoglobulin M [IgM]: Secondary | ICD-10-CM

## 2019-05-31 LAB — IGG, IGA, IGM
IgG (Immunoglobin G), Serum: 918 mg/dL (ref 600–1540)
IgM, Serum: 41 mg/dL — ABNORMAL LOW (ref 50–300)
Immunoglobulin A: 118 mg/dL (ref 70–320)

## 2019-05-31 LAB — IGE: IgE (Immunoglobulin E), Serum: 30 kU/L (ref <=114)

## 2019-05-31 NOTE — Telephone Encounter (Signed)
I spoke with Mrs. Olshefski about her lab results-specifically that her IgM level is low.  She has normal IgG and IgA levels.  With her history of very frequent sinus and pulmonary infections I am concerned for an immunodeficiency.  I will refer her to allergy and immunology for additional evaluation.  Julian Hy, DO 05/31/19 9:04 AM [facility]

## 2019-06-01 ENCOUNTER — Telehealth: Payer: Self-pay | Admitting: Critical Care Medicine

## 2019-06-01 NOTE — Telephone Encounter (Signed)
Referral was placed yesterday.  I sent it to [facility] yesterday.  They will call the pt with appointment.

## 2019-06-01 NOTE — Telephone Encounter (Signed)
JC please look out for information in your box. Thanks

## 2019-06-05 NOTE — Telephone Encounter (Signed)
Spoke with [facility]. Dr. Carlis Abbott has the CT and paperwork and will review it.

## 2019-06-06 ENCOUNTER — Encounter: Payer: Self-pay | Admitting: Allergy and Immunology

## 2019-06-06 ENCOUNTER — Other Ambulatory Visit: Payer: Self-pay

## 2019-06-06 ENCOUNTER — Ambulatory Visit (INDEPENDENT_AMBULATORY_CARE_PROVIDER_SITE_OTHER): Payer: Medicare Other | Admitting: Allergy and Immunology

## 2019-06-06 VITALS — BP 158/68 | HR 60 | Temp 96.8°F | Resp 16 | Ht 61.5 in | Wt 190.4 lb

## 2019-06-06 DIAGNOSIS — J310 Chronic rhinitis: Secondary | ICD-10-CM

## 2019-06-06 DIAGNOSIS — J329 Chronic sinusitis, unspecified: Secondary | ICD-10-CM | POA: Diagnosis not present

## 2019-06-06 DIAGNOSIS — J4540 Moderate persistent asthma, uncomplicated: Secondary | ICD-10-CM | POA: Diagnosis not present

## 2019-06-06 MED ORDER — AZELASTINE-FLUTICASONE 137-50 MCG/ACT NA SUSP: 1.0000 | Freq: Two times a day (BID) | NASAL | 5 refills | Status: AC | PRN

## 2019-06-06 NOTE — Assessment & Plan Note (Signed)
   For now, continue Breo Ellipta 100-25 g, 1 inhalation daily, montelukast 10 mg daily, and Xopenex every 4-6 hours if needed.

## 2019-06-06 NOTE — Assessment & Plan Note (Signed)
All seasonal and perennial aeroallergen skin tests are negative despite a positive histamine control.  Intranasal steroids, intranasal antihistamines, and first generation antihistamines are effective for symptoms associated with non-allergic rhinitis, whereas second generation antihistamines such as cetirizine (Zyrtec), loratadine (Claritin) and fexofenadine (Allegra) have been found to be ineffective for this condition.  A prescription has been provided for azelastine/fluticasone nasal spray, one spray per nostril 1-2 times daily as needed. Proper nasal spray technique has been discussed and demonstrated.  Nasal saline lavage (NeilMed) has been recommended as needed and prior to medicated nasal sprays along with instructions for proper administration.  For thick post nasal drainage, add guaifenesin 600 mg (Mucinex)  twice daily as needed with adequate hydration as discussed.

## 2019-06-06 NOTE — Assessment & Plan Note (Signed)
Based upon lab work over this past year, IgG levels are within normal limits, streptococcal titers are within normal limits.  There was mild IgM depression on one of the studies, however most likely not enough to be clinically significant.  Treatment plan as outlined above for chronic rhinitis.  If this problem persists or progresses, further evaluation by otolaryngology may be warranted.

## 2019-06-06 NOTE — Patient Instructions (Addendum)
Chronic rhinitis All seasonal and perennial aeroallergen skin tests are negative despite a positive histamine control.  Intranasal steroids, intranasal antihistamines, and first generation antihistamines are effective for symptoms associated with non-allergic rhinitis, whereas second generation antihistamines such as cetirizine (Zyrtec), loratadine (Claritin) and fexofenadine (Allegra) have been found to be ineffective for this condition.  A prescription has been provided for azelastine/fluticasone nasal spray, one spray per nostril 1-2 times daily as needed. Proper nasal spray technique has been discussed and demonstrated.  Nasal saline lavage (NeilMed) has been recommended as needed and prior to medicated nasal sprays along with instructions for proper administration.  For thick post nasal drainage, add guaifenesin 600 mg (Mucinex)  twice daily as needed with adequate hydration as discussed.  Recurrent sinusitis Based upon lab work over this past year, IgG levels are within normal limits, streptococcal titers are within normal limits.  There was mild IgM depression on one of the studies, however most likely not enough to be clinically significant.  Treatment plan as outlined above for chronic rhinitis.  If this problem persists or progresses, further evaluation by otolaryngology may be warranted.  Moderate persistent asthma  For now, continue Breo Ellipta 100-25 g, 1 inhalation daily, montelukast 10 mg daily, and Xopenex every 4-6 hours if needed.   Follow-up with pulmonologist and, if needed, ENT (otolaryngologist).

## 2019-06-06 NOTE — Progress Notes (Signed)
New Patient Note  RE: Betty Sheppard MRN: [id number] DOB: 1945/09/03 Date of Office Visit: 06/06/2019  Referring provider: Steffanie Dunnlark, Laura P, DO Primary care provider: Heron NayYoung, Lauren E, PA  Chief Complaint: Sinus Problem and Asthma   History of present illness: Betty Sheppard is a 73 y.o. female seen today in consultation requested by Tobie LordsLauren Young, PA. She reports that over the past 20 years she has experienced recurrent sinus infections.  She reports that the sinus infections progressed into bronchitis.  She reports that she typically requires corticosteroid injections and Rocephin.  In addition to nasal congestion, postnasal drainage, and sinus pressure, over this past year she has also experienced an altered sense of smell and which "laundry detergent and skunk smell the same."  She has been evaluated by an otolaryngologist who recommended that she use budesonide nasal spray.  She has noticed mild improvement with the budesonide nasal spray but still experiences nasal/sinus symptoms.  She reports that she has been diagnosed with asthma.  She is started on Breo Ellipta 100-25 g, 1 inhalation daily, approximately 1 week ago with some benefit, however she is been unable to discern the extent of the benefit given the short duration of use.  She discontinued montelukast, cetirizine, and Mucinex over the past 3 days in anticipation of today's skin testing and has noticed increased symptoms while off of these medications.  Assessment and plan: Chronic rhinitis All seasonal and perennial aeroallergen skin tests are negative despite a positive histamine control.  Intranasal steroids, intranasal antihistamines, and first generation antihistamines are effective for symptoms associated with non-allergic rhinitis, whereas second generation antihistamines such as cetirizine (Zyrtec), loratadine (Claritin) and fexofenadine (Allegra) have been found to be ineffective for this condition.  A prescription has been  provided for azelastine/fluticasone nasal spray, one spray per nostril 1-2 times daily as needed. Proper nasal spray technique has been discussed and demonstrated.  Nasal saline lavage (NeilMed) has been recommended as needed and prior to medicated nasal sprays along with instructions for proper administration.  For thick post nasal drainage, add guaifenesin 600 mg (Mucinex)  twice daily as needed with adequate hydration as discussed.  Recurrent sinusitis Based upon lab work over this past year, IgG levels are within normal limits, streptococcal titers are within normal limits.  There was mild IgM depression on one of the studies, however most likely not enough to be clinically significant.  Treatment plan as outlined above for chronic rhinitis.  If this problem persists or progresses, further evaluation by otolaryngology may be warranted.  Moderate persistent asthma  For now, continue Breo Ellipta 100-25 g, 1 inhalation daily, montelukast 10 mg daily, and Xopenex every 4-6 hours if needed.   Meds ordered this encounter  Medications   Azelastine-Fluticasone 137-50 MCG/ACT SUSP    Sig: Place 1 spray into both nostrils 2 (two) times daily as needed.    Dispense:  23 g    Refill:  5    Diagnostics: Spirometry: FVC was 2.25 L (83% predicted) and FEV1 was 1.78 L (88% predicted) without postbronchodilator improvement.  This study was performed while the patient was asymptomatic.  Please see scanned spirometry results for details. Epicutaneous testing: Negative. Intradermal testing: Negative.  Physical examination: Blood pressure (!) 158/68, pulse 60, temperature (!) 96.8 F (36 C), temperature source Temporal, resp. rate 16, height 5' 1.5" (1.562 m), weight 190 lb 6.4 oz (86.4 kg), SpO2 98 %.  General: Alert, interactive, in no acute distress. HEENT: TMs pearly gray, turbinates moderately edematous with clear  discharge, post-pharynx moderately erythematous. Neck: Supple without  lymphadenopathy. Lungs: Clear to auscultation without wheezing, rhonchi or rales. CV: Regular rate and rhythm.  Systolic murmur. Abdomen: Nondistended, nontender. Skin: Warm and dry, without lesions or rashes. Extremities:  No clubbing, cyanosis or edema. Neuro:   Grossly intact.  Review of systems: Review of systems negative except as noted in HPI / PMHx or noted below: Review of Systems  Constitutional: Negative.   HENT: Negative.   Eyes: Negative.   Respiratory: Negative.   Cardiovascular: Negative.   Gastrointestinal: Negative.   Genitourinary: Negative.   Musculoskeletal: Negative.   Skin: Negative.   Neurological: Negative.   Endo/Heme/Allergies: Negative.   Psychiatric/Behavioral: Negative.     Past medical history:  Past Medical History:  Diagnosis Date   Asthma    Atrial flutter (HCC)    Hypertension    Iron deficiency anemia    Obesity    Osteoporosis    PUD (peptic ulcer disease)     Past surgical history:  Past Surgical History:  Procedure Laterality Date   BLADDER SUSPENSION     CARDIAC ELECTROPHYSIOLOGY STUDY AND ABLATION  2020   GASTRIC BYPASS     NASAL SINUS SURGERY     b/l antrostomies   TUBAL LIGATION      Family history: Family History  Problem Relation Age of Onset   Sinusitis Mother    Heart disease Mother    Lung cancer Father    Autoimmune disease Sister        Goodpasture disease   Autoimmune disease Paternal Aunt        Goodpasture disease   Autoimmune disease Paternal Uncle        Goodpasture disease    Social history: Social History   Socioeconomic History   Marital status: Married    Spouse name: Not on file   Number of children: Not on file   Years of education: Not on file   Highest education level: Not on file  Occupational History   Not on file  Social Needs   Financial resource strain: Not on file   Food insecurity    Worry: Not on file    Inability: Not on file   Transportation  needs    Medical: Not on file    Non-medical: Not on file  Tobacco Use   Smoking status: Never Smoker   Smokeless tobacco: Never Used  Substance and Sexual Activity   Alcohol use: No   Drug use: Never   Sexual activity: Not on file  Lifestyle   Physical activity    Days per week: Not on file    Minutes per session: Not on file   Stress: Not on file  Relationships   Social connections    Talks on phone: Not on file    Gets together: Not on file    Attends religious service: Not on file    Active member of club or organization: Not on file    Attends meetings of clubs or organizations: Not on file    Relationship status: Not on file   Intimate partner violence    Fear of current or ex partner: Not on file    Emotionally abused: Not on file    Physically abused: Not on file    Forced sexual activity: Not on file  Other Topics Concern   Not on file  Social History Narrative   Not on file    Environmental History: The patient lives in a 71 year old house with  carpeting in the bedroom, central air, and window air conditioning units.  There are no pets in the home.  There is no known mold/water damage in the home.  The patient is a never smoker.  Current Outpatient Medications  Medication Sig Dispense Refill   amoxicillin (AMOXIL) 500 MG capsule Take 500 mg by mouth 3 (three) times daily.     apixaban (ELIQUIS) 5 MG TABS tablet Take 5 mg by mouth daily.     budesonide (PULMICORT) 0.5 MG/2ML nebulizer solution Inhale 0.5 mg into the lungs as needed.     budesonide (RHINOCORT AQUA) 32 MCG/ACT nasal spray Place 1 spray into both nostrils daily.     calcium-vitamin D (OSCAL WITH D) 500-200 MG-UNIT per tablet Take 1 tablet by mouth.     cetirizine (ZYRTEC) 10 MG tablet Take 1 tablet (10 mg total) by mouth daily. 30 tablet 11   cyanocobalamin (,VITAMIN B-12,) 1000 MCG/ML injection Inject 1,000 mcg into the muscle every 30 (thirty) days.     cycloSPORINE (RESTASIS)  0.05 % ophthalmic emulsion Place 0.05 drops into both eyes daily.     dextromethorphan-guaiFENesin (MUCINEX DM) 30-600 MG 12hr tablet Take 1 tablet by mouth 2 (two) times daily.      diltiazem (DILACOR XR) 240 MG 24 hr capsule Take 240 mg by mouth daily.     fluticasone furoate-vilanterol (BREO ELLIPTA) 100-25 MCG/INH AEPB Inhale 1 puff into the lungs daily. 1 each 11   levalbuterol (XOPENEX HFA) 45 MCG/ACT inhaler Inhale 1 puff into the lungs as needed.     Loteprednol Etabonate 0.5 % OINT Place into both eyes daily as needed.     montelukast (SINGULAIR) 10 MG tablet Take 10 mg by mouth at bedtime.     Multiple Vitamins-Minerals (MULTIVITAMIN WITH MINERALS) tablet Take by mouth.     oxyCODONE-acetaminophen (TYLOX) 5-500 MG per capsule Take 1 capsule by mouth every 4 (four) hours as needed for pain.     pantoprazole (PROTONIX) 40 MG tablet Take 40 mg by mouth daily.     potassium gluconate 595 (99 K) MG TABS tablet Take 99 mg by mouth daily.     Probiotic Product (PHILLIPS COLON HEALTH) CAPS Take 1 capsule by mouth daily.     tobramycin (TOBREX) 0.3 % ophthalmic solution Place into both eyes as needed.     Zinc Gluconate 50 MG CAPS Take 50 mg by mouth daily.     Azelastine-Fluticasone 137-50 MCG/ACT SUSP Place 1 spray into both nostrils 2 (two) times daily as needed. 23 g 5   diclofenac Sodium (VOLTAREN) 1 % GEL Apply 1 application topically as needed.      gabapentin (NEURONTIN) 300 MG capsule Take 300 mg by mouth as needed.     No current facility-administered medications for this visit.     Known medication allergies: Allergies  Allergen Reactions   Latex    Levaquin [Levofloxacin]    Nsaids    Prednisone     I appreciate the opportunity to take part in Tache's care. Please do not hesitate to contact me with questions.  Sincerely,   R. Jorene Guest, MD

## 2019-06-21 ENCOUNTER — Encounter: Payer: Self-pay | Admitting: Critical Care Medicine

## 2019-06-21 NOTE — Progress Notes (Signed)
LMTCB Patient dropped off 2 discs to be scanned and they have been returned. Called patient to see if she wanted to pick them up.

## 2019-07-23 ENCOUNTER — Other Ambulatory Visit (HOSPITAL_COMMUNITY)
Admission: RE | Admit: 2019-07-23 | Discharge: 2019-07-23 | Disposition: A | Discharge status: Home / Self Care | Payer: Medicare Other | Source: Ambulatory Visit | Attending: Critical Care Medicine | Admitting: Critical Care Medicine

## 2019-07-23 DIAGNOSIS — Z01812 Encounter for preprocedural laboratory examination: Secondary | ICD-10-CM | POA: Diagnosis present

## 2019-07-23 DIAGNOSIS — Z20822 Contact with and (suspected) exposure to covid-19: Secondary | ICD-10-CM | POA: Diagnosis not present

## 2019-07-23 LAB — SARS CORONAVIRUS 2 (TAT 6-24 HRS): SARS Coronavirus 2: NEGATIVE

## 2019-07-26 ENCOUNTER — Encounter: Payer: Self-pay | Admitting: Critical Care Medicine

## 2019-07-26 ENCOUNTER — Ambulatory Visit (INDEPENDENT_AMBULATORY_CARE_PROVIDER_SITE_OTHER): Payer: Medicare Other | Admitting: Critical Care Medicine

## 2019-07-26 ENCOUNTER — Other Ambulatory Visit: Payer: Self-pay

## 2019-07-26 VITALS — BP 136/60 | HR 70 | Ht 61.5 in | Wt 192.0 lb

## 2019-07-26 DIAGNOSIS — R059 Cough, unspecified: Secondary | ICD-10-CM

## 2019-07-26 DIAGNOSIS — J4540 Moderate persistent asthma, uncomplicated: Secondary | ICD-10-CM

## 2019-07-26 DIAGNOSIS — J309 Allergic rhinitis, unspecified: Secondary | ICD-10-CM

## 2019-07-26 DIAGNOSIS — R05 Cough: Secondary | ICD-10-CM

## 2019-07-26 LAB — PULMONARY FUNCTION TEST
DL/VA % pred: 147 %
DL/VA: 6.19 ml/min/mmHg/L
DLCO unc % pred: 129 %
DLCO unc: 22.95 ml/min/mmHg
FEF 25-75 Post: 2.5 L/sec
FEF 25-75 Pre: 2.44 L/sec
FEF2575-%Change-Post: 2 %
FEF2575-%Pred-Post: 146 %
FEF2575-%Pred-Pre: 142 %
FEV1-%Change-Post: 0 %
FEV1-%Pred-Post: 91 %
FEV1-%Pred-Pre: 91 %
FEV1-Post: 1.84 L
FEV1-Pre: 1.83 L
FEV1FVC-%Change-Post: 0 %
FEV1FVC-%Pred-Pre: 111 %
FEV6-%Change-Post: 1 %
FEV6-%Pred-Post: 86 %
FEV6-%Pred-Pre: 85 %
FEV6-Post: 2.19 L
FEV6-Pre: 2.17 L
FEV6FVC-%Pred-Post: 104 %
FEV6FVC-%Pred-Pre: 104 %
FVC-%Change-Post: 1 %
FVC-%Pred-Post: 82 %
FVC-%Pred-Pre: 81 %
FVC-Post: 2.19 L
FVC-Pre: 2.17 L
Post FEV1/FVC ratio: 84 %
Post FEV6/FVC ratio: 100 %
Pre FEV1/FVC ratio: 84 %
Pre FEV6/FVC Ratio: 100 %
RV % pred: 88 %
RV: 1.85 L
TLC % pred: 91 %
TLC: 4.26 L

## 2019-07-26 NOTE — Progress Notes (Signed)
Synopsis: Referred in December 2020 for bronchiectasis, cough by Tobie Lords, PA.  Subjective:   PATIENT ID: Betty Sheppard GENDER: female DOB: 1946-03-24, MRN: [id number]  Chief Complaint  Patient presents with  . Follow-up    PFT performed today.  Pt states she is doing better since last visit and denies any current complaints. Pt states her cough is now better.     Betty Sheppard is a 74 year old woman who presents for follow-up of allergic rhinosinusitis and asthma.  She was started on Breo, cetirizine, montelukast at her last visit.  Her dyspnea on exertion is significantly improved; she no longer has to stop and is able to recover faster.  Previous exacerbation sinus infection fully resolved.  She is moving around more easily.  Her wheezing is resolved and her cough is significant improved.  Her nasal drainage is better, but never fully resolves.  She followed up with Dr. Nunzio Cobbs on 06/06/2019 at [facility].  He does not feel that she has significant immunodeficiency.  He started her on azelastine and stopped her intranasal steroids.  He added nasal saline lavages and Mucinex to her regimen, which have improved her symptoms.  She had her first Covid vaccine completed; no symptoms.      OV 05/30/2019: Betty Sheppard is a 74 year old woman with a history of recurrent sinusitis and reported bronchiectasis who presents to establish care by her primary care provider.  She was diagnosed with bronchiectasis based on an abnormal CT several years ago, but subsequent CT scans have not redemonstrated this per the report she provided.  She has had chronic sinus infections, requiring her to be on chronic suppressive once daily amoxicillin for many years.  When she gets a sinus infection it quickly moves to her chest where she has productive cough, wheezing, nasal congestion.  To get over this she requires a very prolonged course of antibiotics and a steroid shot.  If her symptoms have not fully  resolved when she comes off antibiotics her symptoms will recur.  She often ends up requiring multiple doses of antibiotics.  When she has these episodes she has cough, thick sputum, wheezing, and severe fatigue.  She describes her sputum as looking like bread dough.  When she is at her baseline she does not have symptoms at all and is very active.  She has these episodes multiple times a year, sometimes as frequently as every other month. They turn into pneumonia but she has always had "haziness" on chest xrays rather than a lobar pneumonia. These episodes began about 20 years ago.  She had no childhood lung or sinus diseases.  There is no family history of immune deficiencies, bronchiectasis, or asthma.  Her mother had sinus issues, but nothing like her severity.  She takes Mucinex twice daily, Rhinocort nasal spray daily, sinus rinses daily, and levalbuterol as needed.  During her episodes she will use Pulmicort nebulizers multiple times per day.  Weather changes and being in hot air worsen her symptoms.  Her symptoms are most common/worst from October to February and again in June every every year.  She has never had allergy testing or been treated with long-acting inhalers.  She denies a history of rashes or eczema.  She is followed by an ENT who has encouraged ongoing use of Rhinocort and saline rinses.  She was previously seen by pulmonologist Dr. Jerre Simon at [place].  She was previously seen by rheumatology and had negative work-up.  She had a recent episode that put  her in the hospital at [facility].  She had a CT chest in October 2020 and an MRI of her head to evaluate her sinuses (postsurgical changes, but no other abnormalities).  She was seen in urgent care 5 days ago for ongoing symptoms and was prescribed doxycycline for 10 days and given a Kenalog shot. She has had her seasonal flu vaccine.   Past Medical History:  Diagnosis Date  . Aortic stenosis, mild   . Asthma   . Atrial flutter (HCC)   .  Hypertension   . Iron deficiency anemia   . Obesity   . Osteoporosis   . PUD (peptic ulcer disease)      Family History  Problem Relation Age of Onset  . Sinusitis Mother   . Heart disease Mother   . Lung cancer Father   . Autoimmune disease Sister        Goodpasture disease  . Autoimmune disease Paternal Aunt        Goodpasture disease  . Autoimmune disease Paternal Uncle        Goodpasture disease     Past Surgical History:  Procedure Laterality Date  . BLADDER SUSPENSION    . CARDIAC ELECTROPHYSIOLOGY STUDY AND ABLATION  2020  . GASTRIC BYPASS    . NASAL SINUS SURGERY     b/l antrostomies  . TUBAL LIGATION      Social History   Socioeconomic History  . Marital status: Married    Spouse name: Not on file  . Number of children: Not on file  . Years of education: Not on file  . Highest education level: Not on file  Occupational History  . Not on file  Tobacco Use  . Smoking status: Never Smoker  . Smokeless tobacco: Never Used  Substance and Sexual Activity  . Alcohol use: No  . Drug use: Never  . Sexual activity: Not on file  Other Topics Concern  . Not on file  Social History Narrative  . Not on file   Social Determinants of Health   Financial Resource Strain:   . Difficulty of Paying Living Expenses: Not on file  Food Insecurity:   . Worried About Programme researcher, broadcasting/film/videounning Out of Food in the Last Year: Not on file  . Ran Out of Food in the Last Year: Not on file  Transportation Needs:   . Lack of Transportation (Medical): Not on file  . Lack of Transportation (Non-Medical): Not on file  Physical Activity:   . Days of Exercise per Week: Not on file  . Minutes of Exercise per Session: Not on file  Stress:   . Feeling of Stress : Not on file  Social Connections:   . Frequency of Communication with Friends and Family: Not on file  . Frequency of Social Gatherings with Friends and Family: Not on file  . Attends Religious Services: Not on file  . Active Member of  Clubs or Organizations: Not on file  . Attends BankerClub or Organization Meetings: Not on file  . Marital Status: Not on file  Intimate Partner Violence:   . Fear of Current or Ex-Partner: Not on file  . Emotionally Abused: Not on file  . Physically Abused: Not on file  . Sexually Abused: Not on file     Allergies  Allergen Reactions  . Latex   . Levaquin [Levofloxacin]   . Nsaids   . Prednisone      Immunization History  Administered Date(s) Administered  . Influenza Split 03/20/2009, 03/30/2010,  04/16/2011, 04/04/2012, 04/05/2013  . Influenza, High Dose Seasonal PF 04/22/2014, 05/05/2015, 04/12/2016, 04/14/2017, 04/19/2019  . Influenza, Seasonal, Injecte, Preservative Fre 04/04/2012, 04/05/2013, 04/22/2014  . Influenza,inj,Quad PF,6+ Mos 04/04/2012, 04/05/2013  . Influenza,inj,quad, With Preservative 03/20/2009, 03/30/2010, 04/16/2011, 04/05/2013  . Influenza-Unspecified 03/20/2009, 03/30/2010, 04/16/2011, 04/04/2012, 04/04/2012, 04/05/2013, 04/05/2013, 04/22/2014, 05/05/2015, 04/12/2016, 04/14/2017, 04/03/2018  . PFIZER SARS-COV-2 Vaccination 07/18/2019  . Pneumococcal Conjugate-13 05/19/2015, 05/19/2015  . Pneumococcal Polysaccharide-23 11/04/2007, 11/04/2007, 10/11/2008, 10/11/2008, 02/28/2013, 02/28/2013, 12/14/2018  . Pneumococcal-Unspecified 11/04/2007, 10/11/2008, 02/28/2013  . Tdap 12/14/2018  . Zoster 05/16/2012  . Zoster Recombinat (Shingrix) 05/16/2012, 05/16/2012    Outpatient Medications Prior to Visit  Medication Sig Dispense Refill  . amoxicillin (AMOXIL) 500 MG capsule Take 500 mg by mouth daily.     Marland Kitchen. apixaban (ELIQUIS) 5 MG TABS tablet Take 5 mg by mouth daily.    . Azelastine-Fluticasone 137-50 MCG/ACT SUSP Place 1 spray into both nostrils 2 (two) times daily as needed. 23 g 5  . budesonide (PULMICORT) 0.5 MG/2ML nebulizer solution Inhale 0.5 mg into the lungs as needed.    . budesonide (RHINOCORT AQUA) 32 MCG/ACT nasal spray Place 1 spray into both nostrils  daily.    . calcium-vitamin D (OSCAL WITH D) 500-200 MG-UNIT per tablet Take 1 tablet by mouth.    . cetirizine (ZYRTEC) 10 MG tablet Take 1 tablet (10 mg total) by mouth daily. 30 tablet 11  . cyanocobalamin (,VITAMIN B-12,) 1000 MCG/ML injection Inject 1,000 mcg into the muscle every 30 (thirty) days.    . cycloSPORINE (RESTASIS) 0.05 % ophthalmic emulsion Place 0.05 drops into both eyes daily.    Marland Kitchen. dextromethorphan-guaiFENesin (MUCINEX DM) 30-600 MG 12hr tablet Take 1 tablet by mouth 2 (two) times daily.     . diclofenac Sodium (VOLTAREN) 1 % GEL Apply 1 application topically as needed.     . diltiazem (DILACOR XR) 240 MG 24 hr capsule Take 240 mg by mouth daily.    . fluticasone furoate-vilanterol (BREO ELLIPTA) 100-25 MCG/INH AEPB Inhale 1 puff into the lungs daily. 1 each 11  . gabapentin (NEURONTIN) 300 MG capsule Take 300 mg by mouth as needed.    . levalbuterol (XOPENEX HFA) 45 MCG/ACT inhaler Inhale 1 puff into the lungs as needed.    . Loteprednol Etabonate 0.5 % OINT Place into both eyes daily as needed.    . montelukast (SINGULAIR) 10 MG tablet Take 10 mg by mouth at bedtime.    . Multiple Vitamins-Minerals (MULTIVITAMIN WITH MINERALS) tablet Take by mouth.    . oxyCODONE-acetaminophen (TYLOX) 5-500 MG per capsule Take 1 capsule by mouth every 4 (four) hours as needed for pain.    . pantoprazole (PROTONIX) 40 MG tablet Take 40 mg by mouth daily.    . potassium gluconate 595 (99 K) MG TABS tablet Take 99 mg by mouth daily.    . Probiotic Product (PH[place]IPS COLON HEALTH) CAPS Take 1 capsule by mouth daily.    Marland Kitchen. tobramycin (TOBREX) 0.3 % ophthalmic solution Place into both eyes as needed.    . Zinc Gluconate 50 MG CAPS Take 50 mg by mouth daily.     No facility-administered medications prior to visit.    Review of Systems  Constitutional: Negative for chills, fever and weight loss.  HENT: Positive for congestion. Negative for nosebleeds.        Post nasal drip  Eyes: Negative.     Respiratory: Positive for cough, sputum production, shortness of breath and wheezing.   Cardiovascular: Negative for chest pain  and leg swelling.  Gastrointestinal: Negative for heartburn, nausea and vomiting.  Genitourinary: Negative.   Musculoskeletal: Positive for joint pain.  Skin: Negative for rash.  Neurological: Negative.   Endo/Heme/Allergies: Negative.   Psychiatric/Behavioral: Negative.      Objective:   Vitals:   07/26/19 0956  BP: 136/60  Pulse: 70  SpO2: 99%  Weight: 192 lb (87.1 kg)  Height: 5' 1.5" (1.562 m)   99% on   RA BMI Readings from Last 3 Encounters:  07/26/19 35.69 kg/m  06/06/19 35.39 kg/m  05/30/19 35.07 kg/m   Wt Readings from Last 3 Encounters:  07/26/19 192 lb (87.1 kg)  06/06/19 190 lb 6.4 oz (86.4 kg)  05/30/19 190 lb 9.6 oz (86.5 kg)    Physical Exam Vitals reviewed.  Constitutional:      General: She is not in acute distress.    Appearance: Normal appearance. She is obese. She is not ill-appearing.  HENT:     Head: Normocephalic and atraumatic.     Nose:     Comments: Deferred due to masking requirement.    Mouth/Throat:     Comments: Deferred due to masking requirement. Eyes:     General: No scleral icterus. Cardiovascular:     Rate and Rhythm: Normal rate and regular rhythm.     Comments: Upper bilateral sternal border 3/6 systolic murmur Pulmonary:     Comments: Breathing comfortably on room air, no conversational dyspnea.  Clear to auscultation bilaterally.  No witnessed coughing. Abdominal:     General: There is no distension.     Palpations: Abdomen is soft.     Tenderness: There is no abdominal tenderness.  Musculoskeletal:        General: No deformity.     Cervical back: Neck supple.  Lymphadenopathy:     Cervical: No cervical adenopathy.  Skin:    General: Skin is warm and dry.     Findings: No rash.  Neurological:     General: No focal deficit present.     Mental Status: She is alert.     Motor: No  weakness.     Coordination: Coordination normal.  Psychiatric:        Mood and Affect: Mood normal.        Behavior: Behavior normal.      CBC    Component Value Date/Time   WBC 5.4 05/30/2019 1029   RBC 4.13 05/30/2019 1029   HGB 12.5 05/30/2019 1029   HCT 38.8 05/30/2019 1029   PLT 256.0 05/30/2019 1029   MCV 93.9 05/30/2019 1029   MCH 29.4 03/02/2015 1245   MCHC 32.2 05/30/2019 1029   RDW 14.7 05/30/2019 1029   LYMPHSABS 0.9 05/30/2019 1029   MONOABS 0.4 05/30/2019 1029   EOSABS 0.1 05/30/2019 1029   BASOSABS 0.0 05/30/2019 1029    CHEMISTRY No results for input(s): NA, K, CL, CO2, GLUCOSE, BUN, CREATININE, CALCIUM, MG, PHOS in the last 168 hours. CrCl cannot be calculated (Patient's most recent lab result is older than the maximum 21 days allowed.).   IgE 30   Chest Imaging- films reviewed: CT abdomen pelvis 03/02/2015, lung films reviewed-lateral poorly defined GGO in inferior lingula.  4 mm RML nodule  CXR, 1 view 03/02/2015-hilar fullness on the right, likely pulmonary artery.  Scoliosis.  Pulmonary Functions Testing Results: PFT Results Latest Ref Rng & Units 07/26/2019  FVC-Pre L 2.17  FVC-Predicted Pre % 81  FVC-Post L 2.19  FVC-Predicted Post % 82  Pre FEV1/FVC % % 84  Post FEV1/FCV % % 84  FEV1-Pre L 1.83  FEV1-Predicted Pre % 91  FEV1-Post L 1.84  DLCO UNC% % 129  DLCO COR %Predicted % 147  TLC L 4.26  TLC % Predicted % 91  RV % Predicted % 88   2021-no significant obstruction or bronchodilator reversibility.  No restriction, hyperinflation, air trapping.  Increased diffusion capacity.  Flow volume loop suggests mild obstruction.  06/06/2019 spirometry at allergist (performed when asymptomatic & on Breo):  FVC 2.25 L (83%)  FEV1 1.78 L (88%)  Ratio 80% No significant BD response    Assessment & Plan:     ICD-10-CM   1. Allergic rhinitis, unspecified seasonality, unspecified trigger  J30.9   2. Moderate persistent asthma without complication   J45.40      Allergic asthma with frequent exacerbations- well-controlled based on symptoms and PFTs today. Normal eos & IGE (possibly had previously been on prednisone) -Continue Breo 100-50 once daily.  Rinse her mouth after every use -Continue with albuterol as needed -Continue Singulair & cetirizine -Continue current sinus regimen -Up-to-date on seasonal flu vaccine.  Agree with finishing the Covid vaccine series.  Chronic sinusitis; no significant immunologic impairment per allergy/immunology -Continue Singulair, cetirizine, azelastine nasal spray -Long-term the goal would be to get her off daily antibiotics due to the concern for resistance.    RTC in 3 months.    Current Outpatient Medications:  .  amoxicillin (AMOXIL) 500 MG capsule, Take 500 mg by mouth daily. , Disp: , Rfl:  .  apixaban (ELIQUIS) 5 MG TABS tablet, Take 5 mg by mouth daily., Disp: , Rfl:  .  Azelastine-Fluticasone 137-50 MCG/ACT SUSP, Place 1 spray into both nostrils 2 (two) times daily as needed., Disp: 23 g, Rfl: 5 .  budesonide (PULMICORT) 0.5 MG/2ML nebulizer solution, Inhale 0.5 mg into the lungs as needed., Disp: , Rfl:  .  budesonide (RHINOCORT AQUA) 32 MCG/ACT nasal spray, Place 1 spray into both nostrils daily., Disp: , Rfl:  .  calcium-vitamin D (OSCAL WITH D) 500-200 MG-UNIT per tablet, Take 1 tablet by mouth., Disp: , Rfl:  .  cetirizine (ZYRTEC) 10 MG tablet, Take 1 tablet (10 mg total) by mouth daily., Disp: 30 tablet, Rfl: 11 .  cyanocobalamin (,VITAMIN B-12,) 1000 MCG/ML injection, Inject 1,000 mcg into the muscle every 30 (thirty) days., Disp: , Rfl:  .  cycloSPORINE (RESTASIS) 0.05 % ophthalmic emulsion, Place 0.05 drops into both eyes daily., Disp: , Rfl:  .  dextromethorphan-guaiFENesin (MUCINEX DM) 30-600 MG 12hr tablet, Take 1 tablet by mouth 2 (two) times daily. , Disp: , Rfl:  .  diclofenac Sodium (VOLTAREN) 1 % GEL, Apply 1 application topically as needed. , Disp: , Rfl:  .  diltiazem  (DILACOR XR) 240 MG 24 hr capsule, Take 240 mg by mouth daily., Disp: , Rfl:  .  fluticasone furoate-vilanterol (BREO ELLIPTA) 100-25 MCG/INH AEPB, Inhale 1 puff into the lungs daily., Disp: 1 each, Rfl: 11 .  gabapentin (NEURONTIN) 300 MG capsule, Take 300 mg by mouth as needed., Disp: , Rfl:  .  levalbuterol (XOPENEX HFA) 45 MCG/ACT inhaler, Inhale 1 puff into the lungs as needed., Disp: , Rfl:  .  Loteprednol Etabonate 0.5 % OINT, Place into both eyes daily as needed., Disp: , Rfl:  .  montelukast (SINGULAIR) 10 MG tablet, Take 10 mg by mouth at bedtime., Disp: , Rfl:  .  Multiple Vitamins-Minerals (MULTIVITAMIN WITH MINERALS) tablet, Take by mouth., Disp: , Rfl:  .  oxyCODONE-acetaminophen (TYLOX)  5-500 MG per capsule, Take 1 capsule by mouth every 4 (four) hours as needed for pain., Disp: , Rfl:  .  pantoprazole (PROTONIX) 40 MG tablet, Take 40 mg by mouth daily., Disp: , Rfl:  .  potassium gluconate 595 (99 K) MG TABS tablet, Take 99 mg by mouth daily., Disp: , Rfl:  .  Probiotic Product ([place]) CAPS, Take 1 capsule by mouth daily., Disp: , Rfl:  .  tobramycin (TOBREX) 0.3 % ophthalmic solution, Place into both eyes as needed., Disp: , Rfl:  .  Zinc Gluconate 50 MG CAPS, Take 50 mg by mouth daily., Disp: , Rfl:    Julian Hy, DO [facility] 07/26/2019 10:26 AM

## 2019-07-26 NOTE — Patient Instructions (Addendum)
Thank you for visiting Dr. Chestine Spore at [facility]. We recommend the following:  Keel all meds the same.   Return in about 3 months (around 10/24/2019).    Please do your part to reduce the spread of COVID-19.

## 2019-07-26 NOTE — Progress Notes (Signed)
PFT done today. 

## 2019-10-15 ENCOUNTER — Other Ambulatory Visit: Payer: Self-pay

## 2019-10-15 ENCOUNTER — Encounter: Payer: Self-pay | Admitting: Critical Care Medicine

## 2019-10-15 ENCOUNTER — Ambulatory Visit (INDEPENDENT_AMBULATORY_CARE_PROVIDER_SITE_OTHER): Payer: Medicare Other | Admitting: Critical Care Medicine

## 2019-10-15 VITALS — BP 150/76 | HR 65 | Temp 97.6°F | Ht 63.0 in | Wt 192.8 lb

## 2019-10-15 DIAGNOSIS — J4540 Moderate persistent asthma, uncomplicated: Secondary | ICD-10-CM

## 2019-10-15 DIAGNOSIS — J309 Allergic rhinitis, unspecified: Secondary | ICD-10-CM

## 2019-10-15 MED ORDER — LEVALBUTEROL TARTRATE 45 MCG/ACT IN AERO: 1.0000 | INHALATION_SPRAY | RESPIRATORY_TRACT | 3 refills | Status: AC | PRN

## 2019-10-15 MED ORDER — DEXAMETHASONE 4 MG PO TABS: 4.0000 mg | ORAL_TABLET | Freq: Two times a day (BID) | ORAL | 0 refills | Status: AC

## 2019-10-15 MED ORDER — CEFUROXIME AXETIL 500 MG PO TABS: 500.0000 mg | ORAL_TABLET | Freq: Two times a day (BID) | ORAL | 0 refills | Status: AC

## 2019-10-15 MED ORDER — BREO ELLIPTA 200-25 MCG/INH IN AEPB: 1.0000 | INHALATION_SPRAY | Freq: Every day | RESPIRATORY_TRACT | 11 refills | Status: AC

## 2019-10-15 NOTE — Patient Instructions (Addendum)
Thank you for visiting Dr. Chestine Spore at [facility]. We recommend the following:   Meds ordered this encounter  Medications  . dexamethasone (DECADRON) 4 MG tablet    Sig: Take 1 tablet (4 mg total) by mouth 2 (two) times daily with a meal for 5 days.    Dispense:  10 tablet    Refill:  0  . fluticasone furoate-vilanterol (BREO ELLIPTA) 200-25 MCG/INH AEPB    Sig: Inhale 1 puff into the lungs daily.    Dispense:  1 each    Refill:  11  . cefUROXime (CEFTIN) 500 MG tablet    Sig: Take 1 tablet (500 mg total) by mouth 2 (two) times daily with a meal.    Dispense:  14 tablet    Refill:  0    Return in about 3 months (around 01/14/2020).    Please do your part to reduce the spread of COVID-19.

## 2019-10-15 NOTE — Progress Notes (Signed)
Synopsis: Referred in December 2020 for bronchiectasis, cough by Tobie Lords, PA.  Subjective:   PATIENT ID: Betty Sheppard GENDER: female DOB: 1945-12-12, MRN: [id number]  Chief Complaint  Patient presents with  . Follow-up    Patient started feeling bad beginning of the month. Was told to take singulair every other day by PCP because she was to dry, but has switched back to everyday. Wheezing Friday was around some smoke which may have made it worse.     Betty Sheppard is a 74 year old woman with a history of allergic rhinosinusitis and asthma who presents for follow-up.  Her symptoms are doing well until the past few weeks when the pollen has been more prevalent where she has had increased nasal congestion, postnasal drip, wheezing.  She feels like the congestion is starting to move down into her throat and is worried it will move into her chest.  Over the weekend she was around a fire pit, which significantly worsened her symptoms.  Her PCP had decreased her Singulair to once daily due to nasal dryness, but she felt that her symptoms were worse, so she increased it back to twice daily.  She has unable to take cetirizine due to dry mouth.  She continues on azelastine nasal spray.  She denies fever, chills, sweats.  Think symptoms she feels that she is likely to need an antibiotic and steroid shot.  She has been told not to take oral steroids in the past due to bariatric surgery and history of PUD.  She reports that her PCP usually gives her a ceftriaxone injection in these situations.  She continues on Breo twice daily.  She is up-to-date on her Covid vaccine.    OV 07/26/19: Betty Sheppard is a 74 year old woman who presents for follow-up of allergic rhinosinusitis and asthma.  She was started on Breo, cetirizine, montelukast at her last visit.  Her dyspnea on exertion is significantly improved; she no longer has to stop and is able to recover faster.  Previous exacerbation sinus infection fully resolved.   She is moving around more easily.  Her wheezing is resolved and her cough is significant improved.  Her nasal drainage is better, but never fully resolves.  She followed up with Dr. Nunzio Cobbs on 06/06/2019 at [facility] asthma.  He does not feel that she has significant immunodeficiency.  He started her on azelastine and stopped her intranasal steroids.  He added nasal saline lavages and Mucinex to her regimen, which have improved her symptoms.  She had her first Covid vaccine completed; no symptoms.   OV 05/30/2019: Betty Sheppard is a 74 year old woman with a history of recurrent sinusitis and reported bronchiectasis who presents to establish care by her primary care provider.  She was diagnosed with bronchiectasis based on an abnormal CT several years ago, but subsequent CT scans have not redemonstrated this per the report she provided.  She has had chronic sinus infections, requiring her to be on chronic suppressive once daily amoxicillin for many years.  When she gets a sinus infection it quickly moves to her chest where she has productive cough, wheezing, nasal congestion.  To get over this she requires a very prolonged course of antibiotics and a steroid shot.  If her symptoms have not fully resolved when she comes off antibiotics her symptoms will recur.  She often ends up requiring multiple doses of antibiotics.  When she has these episodes she has cough, thick sputum, wheezing, and severe fatigue.  She describes her sputum  as looking like bread dough.  When she is at her baseline she does not have symptoms at all and is very active.  She has these episodes multiple times a year, sometimes as frequently as every other month. They turn into pneumonia but she has always had "haziness" on chest xrays rather than a lobar pneumonia. These episodes began about 20 years ago.  She had no childhood lung or sinus diseases.  There is no family history of immune deficiencies, bronchiectasis, or asthma.  Her  mother had sinus issues, but nothing like her severity.  She takes Mucinex twice daily, Rhinocort nasal spray daily, sinus rinses daily, and levalbuterol as needed.  During her episodes she will use Pulmicort nebulizers multiple times per day.  Weather changes and being in hot air worsen her symptoms.  Her symptoms are most common/worst from October to February and again in June every every year.  She has never had allergy testing or been treated with long-acting inhalers.  She denies a history of rashes or eczema.  She is followed by an ENT who has encouraged ongoing use of Rhinocort and saline rinses.  She was previously seen by pulmonologist Dr. Jerre Simon at [place].  She was previously seen by rheumatology and had negative work-up.  She had a recent episode that put her in the hospital at [facility].  She had a CT chest in October 2020 and an MRI of her head to evaluate her sinuses (postsurgical changes, but no other abnormalities).  She was seen in urgent care 5 days ago for ongoing symptoms and was prescribed doxycycline for 10 days and given a Kenalog shot. She has had her seasonal flu vaccine.   Past Medical History:  Diagnosis Date  . Aortic stenosis, mild   . Asthma   . Atrial flutter (HCC)   . Hypertension   . Iron deficiency anemia   . Obesity   . Osteoporosis   . PUD (peptic ulcer disease)      Family History  Problem Relation Age of Onset  . Sinusitis Mother   . Heart disease Mother   . Lung cancer Father   . Autoimmune disease Sister        Goodpasture disease  . Autoimmune disease Paternal Aunt        Goodpasture disease  . Autoimmune disease Paternal Uncle        Goodpasture disease     Past Surgical History:  Procedure Laterality Date  . BLADDER SUSPENSION    . CARDIAC ELECTROPHYSIOLOGY STUDY AND ABLATION  2020  . GASTRIC BYPASS    . NASAL SINUS SURGERY     b/l antrostomies  . TUBAL LIGATION      Social History   Socioeconomic History  . Marital status:  Married    Spouse name: Not on file  . Number of children: Not on file  . Years of education: Not on file  . Highest education level: Not on file  Occupational History  . Not on file  Tobacco Use  . Smoking status: Never Smoker  . Smokeless tobacco: Never Used  Substance and Sexual Activity  . Alcohol use: No  . Drug use: Never  . Sexual activity: Not on file  Other Topics Concern  . Not on file  Social History Narrative  . Not on file   Social Determinants of Health   Financial Resource Strain:   . Difficulty of Paying Living Expenses:   Food Insecurity:   . Worried About [employer] of  Food in the Last Year:   . [place] in the Last Year:   Transportation Needs:   . Film/video editor (Medical):   Marland Kitchen Lack of Transportation (Non-Medical):   Physical Activity:   . Days of Exercise per Week:   . Minutes of Exercise per Session:   Stress:   . Feeling of Stress :   Social Connections:   . Frequency of Communication with Friends and Family:   . Frequency of Social Gatherings with Friends and Family:   . Attends Religious Services:   . Active Member of Clubs or Organizations:   . Attends Archivist Meetings:   Marland Kitchen Marital Status:   Intimate Partner Violence:   . Fear of Current or Ex-Partner:   . Emotionally Abused:   Marland Kitchen Physically Abused:   . Sexually Abused:      Allergies  Allergen Reactions  . Latex   . Levaquin [Levofloxacin]   . Nsaids   . Prednisone      Immunization History  Administered Date(s) Administered  . Influenza Split 03/20/2009, 03/30/2010, 04/16/2011, 04/04/2012, 04/05/2013  . Influenza, High Dose Seasonal PF 04/22/2014, 05/05/2015, 04/12/2016, 04/14/2017, 04/19/2019  . Influenza, Seasonal, Injecte, Preservative Fre 04/04/2012, 04/05/2013, 04/22/2014  . Influenza,inj,Quad PF,6+ Mos 04/04/2012, 04/05/2013  . Influenza,inj,quad, With Preservative 03/20/2009, 03/30/2010, 04/16/2011, 04/05/2013  . Influenza-Unspecified  03/20/2009, 03/30/2010, 04/16/2011, 04/04/2012, 04/04/2012, 04/05/2013, 04/05/2013, 04/22/2014, 05/05/2015, 04/12/2016, 04/14/2017, 04/03/2018  . PFIZER SARS-COV-2 Vaccination 07/18/2019, 08/08/2019  . Pneumococcal Conjugate-13 05/19/2015, 05/19/2015  . Pneumococcal Polysaccharide-23 11/04/2007, 11/04/2007, 10/11/2008, 10/11/2008, 02/28/2013, 02/28/2013, 12/14/2018  . Pneumococcal-Unspecified 11/04/2007, 10/11/2008, 02/28/2013  . Tdap 12/14/2018  . Zoster 05/16/2012  . Zoster Recombinat (Shingrix) 05/16/2012, 05/16/2012    Outpatient Medications Prior to Visit  Medication Sig Dispense Refill  . amoxicillin (AMOXIL) 500 MG capsule Take 500 mg by mouth daily.     Marland Kitchen apixaban (ELIQUIS) 5 MG TABS tablet Take 5 mg by mouth daily.    . Azelastine-Fluticasone 137-50 MCG/ACT SUSP Place 1 spray into both nostrils 2 (two) times daily as needed. 23 g 5  . budesonide (PULMICORT) 0.5 MG/2ML nebulizer solution Inhale 0.5 mg into the lungs as needed.    . Buprenorphine 15 MCG/HR PTWK Place 1 patch onto the skin once a week.    . calcium-vitamin D (OSCAL WITH D) 500-200 MG-UNIT per tablet Take 1 tablet by mouth.    . cetirizine (ZYRTEC) 10 MG tablet Take 1 tablet (10 mg total) by mouth daily. 30 tablet 11  . cyanocobalamin (,VITAMIN B-12,) 1000 MCG/ML injection Inject 1,000 mcg into the muscle every 30 (thirty) days.    . cycloSPORINE (RESTASIS) 0.05 % ophthalmic emulsion Place 0.05 drops into both eyes daily.    Marland Kitchen dextromethorphan-guaiFENesin (MUCINEX DM) 30-600 MG 12hr tablet Take 1 tablet by mouth 2 (two) times daily.     . diclofenac Sodium (VOLTAREN) 1 % GEL Apply 1 application topically as needed.     . diltiazem (DILACOR XR) 240 MG 24 hr capsule Take 240 mg by mouth daily.    Marland Kitchen gabapentin (NEURONTIN) 300 MG capsule Take 300 mg by mouth as needed.    . levalbuterol (XOPENEX HFA) 45 MCG/ACT inhaler Inhale 1 puff into the lungs as needed.    . Loteprednol Etabonate 0.5 % OINT Place into both eyes daily  as needed.    . montelukast (SINGULAIR) 10 MG tablet Take 10 mg by mouth at bedtime.    . Multiple Vitamins-Minerals (MULTIVITAMIN WITH MINERALS) tablet Take by mouth.    Marland Kitchen  oxyCODONE-acetaminophen (TYLOX) 5-500 MG per capsule Take 1 capsule by mouth every 4 (four) hours as needed for pain.    . pantoprazole (PROTONIX) 40 MG tablet Take 40 mg by mouth daily.    . potassium gluconate 595 (99 K) MG TABS tablet Take 99 mg by mouth daily.    . Probiotic Product (PHILLIPS COLON HEALTH) CAPS Take 1 capsule by mouth daily.    Marland Kitchen tobramycin (TOBREX) 0.3 % ophthalmic solution Place into both eyes as needed.    . Zinc Gluconate 50 MG CAPS Take 50 mg by mouth daily.    . fluticasone furoate-vilanterol (BREO ELLIPTA) 100-25 MCG/INH AEPB Inhale 1 puff into the lungs daily. 1 each 11  . budesonide (RHINOCORT AQUA) 32 MCG/ACT nasal spray Place 1 spray into both nostrils daily.     No facility-administered medications prior to visit.    Review of Systems  Constitutional: Negative for chills, fever and weight loss.  HENT: Positive for congestion. Negative for nosebleeds.        Post nasal drip  Eyes: Negative.   Respiratory: Positive for cough, sputum production, shortness of breath and wheezing.   Cardiovascular: Negative for chest pain and leg swelling.  Gastrointestinal: Negative for heartburn, nausea and vomiting.  Genitourinary: Negative.   Musculoskeletal: Positive for joint pain.  Skin: Negative for rash.  Neurological: Negative.   Endo/Heme/Allergies: Negative.   Psychiatric/Behavioral: Negative.      Objective:   Vitals:   10/15/19 0911  BP: (!) 150/76  Pulse: 65  Temp: 97.6 F (36.4 C)  TempSrc: Temporal  SpO2: 96%  Weight: 192 lb 12.8 oz (87.5 kg)  Height: 5\' 3"  (1.6 m)   96% on   RA BMI Readings from Last 3 Encounters:  10/15/19 34.15 kg/m  07/26/19 35.69 kg/m  06/06/19 35.39 kg/m   Wt Readings from Last 3 Encounters:  10/15/19 192 lb 12.8 oz (87.5 kg)  07/26/19 192  lb (87.1 kg)  06/06/19 190 lb 6.4 oz (86.4 kg)    Physical Exam Vitals reviewed.  Constitutional:      Appearance: She is not ill-appearing.  HENT:     Head: Normocephalic and atraumatic.  Eyes:     General: No scleral icterus. Cardiovascular:     Rate and Rhythm: Regular rhythm.  Pulmonary:     Comments: Breathing comfortably on room air, no conversational dyspnea.  Coughing and end expiratory wheezing bilaterally with forced exhalation. Abdominal:     General: There is no distension.     Palpations: Abdomen is soft.  Musculoskeletal:        General: No swelling or deformity.     Cervical back: Neck supple.  Lymphadenopathy:     Cervical: No cervical adenopathy.  Skin:    General: Skin is warm and dry.     Findings: No rash.  Neurological:     General: No focal deficit present.     Mental Status: She is alert.     Coordination: Coordination normal.  Psychiatric:        Mood and Affect: Mood normal.        Behavior: Behavior normal.      CBC    Component Value Date/Time   WBC 5.4 05/30/2019 1029   RBC 4.13 05/30/2019 1029   HGB 12.5 05/30/2019 1029   HCT 38.8 05/30/2019 1029   PLT 256.0 05/30/2019 1029   MCV 93.9 05/30/2019 1029   MCH 29.4 03/02/2015 1245   MCHC 32.2 05/30/2019 1029   RDW 14.7 05/30/2019 1029  LYMPHSABS 0.9 05/30/2019 1029   MONOABS 0.4 05/30/2019 1029   EOSABS 0.1 05/30/2019 1029   BASOSABS 0.0 05/30/2019 1029    CHEMISTRY No results for input(s): NA, K, CL, CO2, GLUCOSE, BUN, CREATININE, CALCIUM, MG, PHOS in the last 168 hours. CrCl cannot be calculated (Patient's most recent lab result is older than the maximum 21 days allowed.).   IgE 30   Chest Imaging- films reviewed: CT abdomen pelvis 03/02/2015, lung films reviewed-lateral poorly defined GGO in inferior lingula.  4 mm RML nodule  CXR, 1 view 03/02/2015-hilar fullness on the right, likely pulmonary artery.  Scoliosis.  Pulmonary Functions Testing Results: PFT Results Latest Ref  Rng & Units 07/26/2019  FVC-Pre L 2.17  FVC-Predicted Pre % 81  FVC-Post L 2.19  FVC-Predicted Post % 82  Pre FEV1/FVC % % 84  Post FEV1/FCV % % 84  FEV1-Pre L 1.83  FEV1-Predicted Pre % 91  FEV1-Post L 1.84  DLCO UNC% % 129  DLCO COR %Predicted % 147  TLC L 4.26  TLC % Predicted % 91  RV % Predicted % 88   2021- no significant obstruction or bronchodilator reversibility.  No restriction, hyperinflation, air trapping.  Increased diffusion capacity.  Flow volume loop suggests mild obstruction.  06/06/2019 spirometry at allergist (performed when asymptomatic & on Breo):  FVC 2.25 L (83%)  FEV1 1.78 L (88%)  Ratio 80% No significant BD response    Assessment & Plan:     ICD-10-CM   1. Moderate persistent asthma, unspecified whether complicated  J45.40   2. Allergic rhinitis, unspecified seasonality, unspecified trigger  J30.9      Allergic asthma with frequent exacerbations-acute exacerbation today.. Normal eos & IGE (possibly had previously been on prednisone) -Increase Breo to 200-50 once daily.  She should continue to rinse her mouth after use -Continue albuterol as needed -Continue Singulair and allergic rhinosinusitis regimen -Dexamethasone 4 mg x 5 days.  We discussed that peptic ulcers are risk with all steroids regardless of the method administered.  Unfortunately we do not have access to IM injections in this clinic. -Cefuroxime twice daily for 1 week.  She should hold her amoxicillin during this. -Up-to-date on flu and Covid vaccines. -She should call us if her symptoms are not improving on antibiotics and steroids.  Chronic sinusitis; no significant immunologic impairment per allergy/immunology -Continue Singulair, azelastine nasal spray -Long-term the goal would be to get her off daily antibiotics due to the concern for resistance.    RTC in 3 months.    Current Outpatient Medications:  .  amoxicillin (AMOXIL) 500 MG capsule, Take 500 mg by mouth daily. ,  Disp: , Rfl:  .  apixaban (ELIQUIS) 5 MG TABS tablet, Take 5 mg by mouth daily., Disp: , Rfl:  .  Azelastine-Fluticasone 137-50 MCG/ACT SUSP, Place 1 spray into both nostrils 2 (two) times daily as needed., Disp: 23 g, Rfl: 5 .  budesonide (PULMICORT) 0.5 MG/2ML nebulizer solution, Inhale 0.5 mg into the lungs as needed., Disp: , Rfl:  .  Buprenorphine 15 MCG/HR PTWK, Place 1 patch onto the skin once a week., Disp: , Rfl:  .  calcium-vitamin D (OSCAL WITH D) 500-200 MG-UNIT per tablet, Take 1 tablet by mouth., Disp: , Rfl:  .  cetirizine (ZYRTEC) 10 MG tablet, Take 1 tablet (10 mg total) by mouth daily., Disp: 30 tablet, Rfl: 11 .  cyanocobalamin (,VITAMIN B-12,) 1000 MCG/ML injection, Inject 1,000 mcg into the muscle every 30 (thirty) days., Disp: , Rfl:  .  cycloSPORINE (RESTASIS) 0.05 % ophthalmic emulsion, Place 0.05 drops into both eyes daily., Disp: , Rfl:  .  dextromethorphan-guaiFENesin (MUCINEX DM) 30-600 MG 12hr tablet, Take 1 tablet by mouth 2 (two) times daily. , Disp: , Rfl:  .  diclofenac Sodium (VOLTAREN) 1 % GEL, Apply 1 application topically as needed. , Disp: , Rfl:  .  diltiazem (DILACOR XR) 240 MG 24 hr capsule, Take 240 mg by mouth daily., Disp: , Rfl:  .  gabapentin (NEURONTIN) 300 MG capsule, Take 300 mg by mouth as needed., Disp: , Rfl:  .  levalbuterol (XOPENEX HFA) 45 MCG/ACT inhaler, Inhale 1 puff into the lungs as needed., Disp: , Rfl:  .  Loteprednol Etabonate 0.5 % OINT, Place into both eyes daily as needed., Disp: , Rfl:  .  montelukast (SINGULAIR) 10 MG tablet, Take 10 mg by mouth at bedtime., Disp: , Rfl:  .  Multiple Vitamins-Minerals (MULTIVITAMIN WITH MINERALS) tablet, Take by mouth., Disp: , Rfl:  .  oxyCODONE-acetaminophen (TYLOX) 5-500 MG per capsule, Take 1 capsule by mouth every 4 (four) hours as needed for pain., Disp: , Rfl:  .  pantoprazole (PROTONIX) 40 MG tablet, Take 40 mg by mouth daily., Disp: , Rfl:  .  potassium gluconate 595 (99 K) MG TABS  tablet, Take 99 mg by mouth daily., Disp: , Rfl:  .  Probiotic Product (PHILLIPS COLON HEALTH) CAPS, Take 1 capsule by mouth daily., Disp: , Rfl:  .  tobramycin (TOBREX) 0.3 % ophthalmic solution, Place into both eyes as needed., Disp: , Rfl:  .  Zinc Gluconate 50 MG CAPS, Take 50 mg by mouth daily., Disp: , Rfl:  .  budesonide (RHINOCORT AQUA) 32 MCG/ACT nasal spray, Place 1 spray into both nostrils daily., Disp: , Rfl:  .  cefUROXime (CEFTIN) 500 MG tablet, Take 1 tablet (500 mg total) by mouth 2 (two) times daily with a meal., Disp: 14 tablet, Rfl: 0 .  dexamethasone (DECADRON) 4 MG tablet, Take 1 tablet (4 mg total) by mouth 2 (two) times daily with a meal for 5 days., Disp: 10 tablet, Rfl: 0 .  fluticasone furoate-vilanterol (BREO ELLIPTA) 200-25 MCG/INH AEPB, Inhale 1 puff into the lungs daily., Disp: 1 each, Rfl: 11   Steffanie DunnLaura P Abi Shoults, DO [facility] 10/15/2019 9:29 AM

## 2019-10-25 ENCOUNTER — Other Ambulatory Visit: Payer: Self-pay

## 2019-10-25 ENCOUNTER — Encounter (HOSPITAL_BASED_OUTPATIENT_CLINIC_OR_DEPARTMENT_OTHER): Payer: Self-pay | Admitting: *Deleted

## 2019-10-25 ENCOUNTER — Emergency Department (HOSPITAL_BASED_OUTPATIENT_CLINIC_OR_DEPARTMENT_OTHER)
Admission: EM | Admit: 2019-10-25 | Discharge: 2019-10-25 | Disposition: A | Discharge status: Home / Self Care | Payer: Medicare Other | Attending: Emergency Medicine | Admitting: Emergency Medicine

## 2019-10-25 ENCOUNTER — Emergency Department (HOSPITAL_BASED_OUTPATIENT_CLINIC_OR_DEPARTMENT_OTHER): Payer: Medicare Other

## 2019-10-25 DIAGNOSIS — Z91040 Latex allergy status: Secondary | ICD-10-CM | POA: Insufficient documentation

## 2019-10-25 DIAGNOSIS — Z79899 Other long term (current) drug therapy: Secondary | ICD-10-CM | POA: Insufficient documentation

## 2019-10-25 DIAGNOSIS — I10 Essential (primary) hypertension: Secondary | ICD-10-CM | POA: Diagnosis not present

## 2019-10-25 DIAGNOSIS — Z7901 Long term (current) use of anticoagulants: Secondary | ICD-10-CM | POA: Insufficient documentation

## 2019-10-25 DIAGNOSIS — Z20822 Contact with and (suspected) exposure to covid-19: Secondary | ICD-10-CM | POA: Insufficient documentation

## 2019-10-25 DIAGNOSIS — J45909 Unspecified asthma, uncomplicated: Secondary | ICD-10-CM | POA: Diagnosis not present

## 2019-10-25 DIAGNOSIS — R531 Weakness: Secondary | ICD-10-CM | POA: Diagnosis present

## 2019-10-25 LAB — TROPONIN I (HIGH SENSITIVITY): Troponin I (High Sensitivity): 7 ng/L (ref <18)

## 2019-10-25 LAB — CBC WITH DIFFERENTIAL/PLATELET
Abs Immature Granulocytes: 0.94 10*3/uL — ABNORMAL HIGH (ref 0.00–0.07)
Basophils Absolute: 0.1 10*3/uL (ref 0.0–0.1)
Basophils Relative: 1 %
Eosinophils Absolute: 0.2 10*3/uL (ref 0.0–0.5)
Eosinophils Relative: 1 %
HCT: 42.4 % (ref 36.0–46.0)
Hemoglobin: 14 g/dL (ref 12.0–15.0)
Immature Granulocytes: 5 %
Lymphocytes Relative: 19 %
Lymphs Abs: 3.3 10*3/uL (ref 0.7–4.0)
MCH: 28.2 pg (ref 26.0–34.0)
MCHC: 33 g/dL (ref 30.0–36.0)
MCV: 85.3 fL (ref 80.0–100.0)
Monocytes Absolute: 1.2 10*3/uL — ABNORMAL HIGH (ref 0.1–1.0)
Monocytes Relative: 7 %
Neutro Abs: 11.6 10*3/uL — ABNORMAL HIGH (ref 1.7–7.7)
Neutrophils Relative %: 67 %
Platelets: 383 10*3/uL (ref 150–400)
RBC: 4.97 MIL/uL (ref 3.87–5.11)
RDW: 15.8 % — ABNORMAL HIGH (ref 11.5–15.5)
WBC: 17.4 10*3/uL — ABNORMAL HIGH (ref 4.0–10.5)
nRBC: 0 % (ref 0.0–0.2)

## 2019-10-25 LAB — COMPREHENSIVE METABOLIC PANEL
ALT: 14 U/L (ref 0–44)
AST: 13 U/L — ABNORMAL LOW (ref 15–41)
Albumin: 3.6 g/dL (ref 3.5–5.0)
Alkaline Phosphatase: 86 U/L (ref 38–126)
Anion gap: 11 (ref 5–15)
BUN: 21 mg/dL (ref 8–23)
CO2: 25 mmol/L (ref 22–32)
Calcium: 8.8 mg/dL — ABNORMAL LOW (ref 8.9–10.3)
Chloride: 100 mmol/L (ref 98–111)
Creatinine, Ser: 0.76 mg/dL (ref 0.44–1.00)
GFR calc Af Amer: >60 mL/min (ref >60)
GFR calc non Af Amer: >60 mL/min (ref >60)
Glucose, Bld: 85 mg/dL (ref 70–99)
Potassium: 3.9 mmol/L (ref 3.5–5.1)
Sodium: 136 mmol/L (ref 135–145)
Total Bilirubin: 0.8 mg/dL (ref 0.3–1.2)
Total Protein: 7.1 g/dL (ref 6.5–8.1)

## 2019-10-25 LAB — URINALYSIS, ROUTINE W REFLEX MICROSCOPIC
Bilirubin Urine: NEGATIVE
Glucose, UA: NEGATIVE mg/dL
Ketones, ur: NEGATIVE mg/dL
Nitrite: NEGATIVE
Protein, ur: NEGATIVE mg/dL
Specific Gravity, Urine: 1.01 (ref 1.005–1.030)
pH: 6 (ref 5.0–8.0)

## 2019-10-25 LAB — URINALYSIS, MICROSCOPIC (REFLEX)

## 2019-10-25 LAB — TSH: TSH: 2.26 u[IU]/mL (ref 0.350–4.500)

## 2019-10-25 LAB — RESPIRATORY PANEL BY RT PCR (FLU A&B, COVID)
Influenza A by PCR: NEGATIVE
Influenza B by PCR: NEGATIVE
SARS Coronavirus 2 by RT PCR: NEGATIVE

## 2019-10-25 NOTE — ED Notes (Signed)
XR bedside.

## 2019-10-25 NOTE — ED Triage Notes (Signed)
Hx off chronic lung disease that has recently been treated. Covid test 2 weeks ago was negative. States she is here for weakness, diaphoresis and feeling like she is going to pass out.

## 2019-10-25 NOTE — ED Notes (Signed)
ED Provider at bedside. 

## 2019-10-25 NOTE — ED Provider Notes (Signed)
[facility] EMERGENCY DEPARTMENT Provider Note   CSN: [id number] Arrival date & time: 10/25/19  1601     History Chief Complaint  Patient presents with  . Weakness    Betty Sheppard is a 74 y.o. female.  HPI      For the last few weeks has had lung infection, took abx, felt better from that standpoint (2 rounds of rocephin/prednisone-has been doing it for years--not having the dyspnea or cough that had, congestion/sinus improved)  Feel like over the lung infection now but having generalized weakness, has been present since beginning of infection but getting worse now even though infectious symptoms gone Very sleepy, just wants to sleep  Hx of ablation, BP ok, no fevers Weak, clammy, cold Kept feeling worse and worse today  HR down to 50s, usually in 60s No chest pain No numbness/weakness/difficulty talking/walking Feels like in haze, 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 Aortic stenosis, mild   . Asthma   . Atrial flutter (HCC)   . Hypertension   . Iron deficiency anemia   . Obesity   . Osteoporosis   . PUD (peptic ulcer disease)     Patient Active Problem List   Diagnosis Date Noted  . Chronic rhinitis 06/06/2019  . Moderate persistent asthma 06/06/2019  . Recurrent sinusitis 06/06/2019    Past Surgical History:  Procedure Laterality Date  . BLADDER SUSPENSION    . CARDIAC ELECTROPHYSIOLOGY STUDY AND ABLATION  2020  . GASTRIC BYPASS    . NASAL SINUS SURGERY     b/l antrostomies  . TUBAL LIGATION       OB History   No obstetric history on file.     Family History  Problem Relation Age of Onset  . Sinusitis Mother   . Heart disease Mother   . Lung cancer Father   . Autoimmune disease Sister        Goodpasture disease  .  Autoimmune disease Paternal Aunt        Goodpasture disease  . Autoimmune disease Paternal Uncle        Goodpasture disease    Social History   Tobacco Use  . Smoking status: Never Smoker  . Smokeless tobacco: Never Used  Substance Use Topics  . Alcohol use: No  . Drug use: Never    Home Medications Prior to Admission medications   Medication Sig Start Date End Date Taking? Authorizing Provider  amoxicillin (AMOXIL) 500 MG capsule Take 500 mg by mouth daily.     [provider]  apixaban (ELIQUIS) 5 MG TABS tablet Take 5 mg by mouth daily. 05/12/19   [provider]  Azelastine-Fluticasone 137-50 MCG/ACT SUSP Place 1 spray into both nostrils 2 (two) times daily as needed. 06/06/19   Bobbitt, Heywood Iles, MD  budesonide (PULMICORT) 0.5 MG/2ML nebulizer solution Inhale 0.5 mg into the lungs as needed. 05/27/18   [provider]  budesonide (RHINOCORT AQUA) 32 MCG/ACT nasal spray Place 1 spray into both nostrils daily. 03/17/11   [provider]  Buprenorphine 15 MCG/HR PTWK Place 1 patch onto the skin once a week. 10/11/19   [provider]  calcium-vitamin D (OSCAL WITH D) 500-200 MG-UNIT per tablet Take 1 tablet by mouth.    [provider]  cefUROXime (CEFTIN) 500 MG tablet Take 1 tablet (500 mg total) by mouth 2 (two) times daily with a meal. 10/15/19   Chestine Spore, Virl Axe, DO  cetirizine (ZYRTEC) 10 MG tablet Take 1 tablet (10 mg total) by mouth daily. 05/30/19   Karie Fetch P, DO  cyanocobalamin (,VITAMIN B-12,) 1000 MCG/ML injection Inject 1,000 mcg into the muscle every 30 (thirty) days. 02/03/15   [provider]  cycloSPORINE (RESTASIS) 0.05 % ophthalmic emulsion Place 0.05 drops into both eyes daily. 06/17/17   [provider]  dextromethorphan-guaiFENesin (MUCINEX DM) 30-600 MG 12hr tablet Take 1 tablet by mouth 2 (two) times daily.     [provider]  diclofenac Sodium (VOLTAREN) 1 % GEL Apply 1  application topically as needed.  01/11/15   [provider]  diltiazem (DILACOR XR) 240 MG 24 hr capsule Take 240 mg by mouth daily.    [provider]  fluticasone furoate-vilanterol (BREO ELLIPTA) 200-25 MCG/INH AEPB Inhale 1 puff into the lungs daily. 10/15/19   Steffanie Dunn, DO  gabapentin (NEURONTIN) 300 MG capsule Take 300 mg by mouth as needed. 05/19/18   [provider]  levalbuterol (XOPENEX HFA) 45 MCG/ACT inhaler Inhale 1 puff into the lungs as needed. 10/15/19   Karie Fetch P, DO  Loteprednol Etabonate 0.5 % OINT Place into both eyes daily as needed. 03/06/18   [provider]  montelukast (SINGULAIR) 10 MG tablet Take 10 mg by mouth at bedtime.    [provider]  Multiple Vitamins-Minerals (MULTIVITAMIN WITH MINERALS) tablet Take by mouth.    [provider]  oxyCODONE-acetaminophen (TYLOX) 5-500 MG per capsule Take 1 capsule by mouth every 4 (four) hours as needed for pain.    [provider]  pantoprazole (PROTONIX) 40 MG tablet Take 40 mg by mouth daily. 03/31/19   [provider]  potassium gluconate 595 (99 K) MG TABS tablet Take 99 mg by mouth daily.    [provider]  Probiotic Product (PHILLIPS COLON HEALTH) CAPS Take 1 capsule by mouth daily.    [provider]  tobramycin (TOBREX) 0.3 % ophthalmic solution Place into both eyes as needed. 06/24/14   [provider]  Zinc Gluconate 50 MG CAPS Take 50 mg by mouth daily. 01/27/19   [provider]    Allergies    Latex, Levaquin [levofloxacin], Nsaids, and Prednisone  Review of Systems   Review of Systems  Constitutional: Positive for activity change and fatigue. Negative for appetite change (thought it was getting better) and fever.  HENT: Negative for congestion and voice change.   Eyes: Positive for visual disturbance (haze).  Respiratory: Negative for cough and shortness of breath.   Cardiovascular: Negative for  chest pain and leg swelling.  Gastrointestinal: Negative for abdominal pain, diarrhea (yesterday twice, better today), nausea and vomiting.  Neurological: Positive for light-headedness. Negative for dizziness, seizures, syncope, facial asymmetry, speech difficulty, weakness, numbness and headaches.    Physical Exam Updated Vital Signs BP (!) 141/59 (BP Location: Right Arm)   Pulse 70   Temp 97.8 F (36.6 C) (Oral)   Resp 14   Ht 5\' 3"  (1.6 m)   Wt 87.5 kg   SpO2 97%   BMI 34.17 kg/m   Physical Exam Vitals and nursing note reviewed.  Constitutional:      General: She is not in acute distress.    Appearance: She is well-developed. She is not diaphoretic.  HENT:     Head:  Normocephalic and atraumatic.  Eyes:     Conjunctiva/sclera: Conjunctivae normal.  Cardiovascular:     Rate and Rhythm: Normal rate and regular rhythm.     Heart sounds: Normal heart sounds. No murmur. No friction rub. No gallop.   Pulmonary:     Effort: Pulmonary effort is normal. No respiratory distress.     Breath sounds: Normal breath sounds. No wheezing or rales.  Abdominal:     General: There is no distension.     Palpations: Abdomen is soft.     Tenderness: There is no abdominal tenderness. There is no guarding.  Musculoskeletal:        General: No tenderness.     Cervical back: Normal range of motion.  Skin:    General: Skin is warm and dry.     Findings: No erythema or rash.  Neurological:     Mental Status: She is alert and oriented to person, place, and time.     GCS: GCS eye subscore is 4. GCS verbal subscore is 5. GCS motor subscore is 6.     Cranial Nerves: Cranial nerves are intact.     Sensory: Sensation is intact. No sensory deficit.     Motor: Motor function is intact.     ED Results / Procedures / Treatments   Labs (all labs ordered are listed, but only abnormal results are displayed) Labs Reviewed  CBC WITH DIFFERENTIAL/PLATELET - Abnormal; Notable for the following  components:      Result Value   WBC 17.4 (*)    RDW 15.8 (*)    Neutro Abs 11.6 (*)    Monocytes Absolute 1.2 (*)    Abs Immature Granulocytes 0.94 (*)    All other components within normal limits  COMPREHENSIVE METABOLIC PANEL - Abnormal; Notable for the following components:   Calcium 8.8 (*)    AST 13 (*)    All other components within normal limits  URINALYSIS, ROUTINE W REFLEX MICROSCOPIC - Abnormal; Notable for the following components:   Hgb urine dipstick SMALL (*)    Leukocytes,Ua TRACE (*)    All other components within normal limits  URINALYSIS, MICROSCOPIC (REFLEX) - Abnormal; Notable for the following components:   Bacteria, UA RARE (*)    All other components within normal limits  RESPIRATORY PANEL BY RT PCR (FLU A&B, COVID)  TSH  TROPONIN I (HIGH SENSITIVITY)  TROPONIN I (HIGH SENSITIVITY)    EKG EKG Interpretation  Date/Time:  Thursday October 25 2019 16:17:59 EDT Ventricular Rate:  71 PR Interval:    QRS Duration: 115 QT Interval:  414 QTC Calculation: 450 R Axis:   -75 Text Interpretation: Sinus or ectopic atrial rhythm Short PR interval Probable left atrial enlargement Left anterior fascicular block Left ventricular hypertrophy Baseline wander in lead(s) I II aVR No previous ECGs available Confirmed by Alvira Monday ([id number]) on 10/25/2019 4:29:32 PM   Radiology DG Chest Portable 1 View  Result Date: 10/25/2019 CLINICAL DATA:  Weakness EXAM: PORTABLE CHEST 1 VIEW COMPARISON:  None. FINDINGS: Heart size is mildly enlarged. Atherosclerotic calcification of the aortic knob. Calcification of the mitral annulus. No focal airspace consolidation, pleural effusion, or pneumothorax. IMPRESSION: No active disease. Electronically Signed   By: Duanne Guess D.O.   On: 10/25/2019 17:13    Procedures Procedures (including critical care time)  Medications Ordered in ED Medications - No data to display  ED Course  I have reviewed the triage vital signs and the  nursing notes.  Pertinent labs &  imaging results that were available during my care of the patient were reviewed by me and considered in my medical decision making (see chart for details).    MDM Rules/Calculators/A&P                      74 year old female with history of atrial flutter, htn, aortic stenosis, presents with concerns for severe fatigue.  Differential diagnosis includes anemia, electrolyte abnormality, cardiac abnormality/arrhythmia/ischemia, infection, hypothyroidism, other toxic/metabolic abnormalities.  No focal neurologic concerns on history or exam to suggest CVA, ICH or other central etiology.  Pt hemodynamically stable, afebrile, and without hx of infectious symptoms.  Urinalysis shows no sign of UTI. Chest x-ray shows no acute abnormalities. CBC showed no sign of anemia.  Leukocytosis may be secondary to recent steroids. Electrolytes without significant abnormalities.  EKG was not changed from prior, patient does not have chest pain, and troponin negative and have low suspicion for cardiac ischemia as etiology of symptoms.  She was noted to have episodes of likely atrial flutter with 2:1 block while in the ED. One episode recorded as VT by pc was while I was examining her and artifactual from moving leads.    While she does not have palpitations, it is possible fatigue maybe secondary to increased episodes of atrial flutter/fib as seen to be in and out of it while in the ED. Recommend Cardiology follow up, consideration of repeat holter monitoring.  Also consider possible etiology of symptoms being post-viral fatigue. COVID negative. TSH normal.  Discussed reasons to return to ED and importance of outpt follow up. Patient discharged in stable condition with understanding of reasons to return.     Final Clinical Impression(s) / ED Diagnoses Final diagnoses:  Generalized weakness    Rx / DC Orders ED Discharge Orders    None       Gareth Morgan, MD 10/26/19 0004

## 2020-06-24 ENCOUNTER — Other Ambulatory Visit: Payer: Self-pay | Admitting: Critical Care Medicine

## 2020-11-20 ENCOUNTER — Other Ambulatory Visit: Payer: Self-pay | Admitting: Critical Care Medicine
